# Patient Record
Sex: Female | Born: 1990 | Race: Black or African American | Hispanic: No | Marital: Single | Smoking: Current every day smoker
Health system: Southern US, Community
[De-identification: ages and names within clinical notes are randomized; demographics above are authoritative.]

## PROBLEM LIST (undated history)

## (undated) DIAGNOSIS — J45909 Unspecified asthma, uncomplicated: Secondary | ICD-10-CM

## (undated) DIAGNOSIS — F99 Mental disorder, not otherwise specified: Secondary | ICD-10-CM

## (undated) HISTORY — DX: Mental disorder, not otherwise specified: F99

---
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
## 2012-07-08 ENCOUNTER — Emergency Department (INDEPENDENT_AMBULATORY_CARE_PROVIDER_SITE_OTHER)
Admission: EM | Admit: 2012-07-08 | Discharge: 2012-07-08 | Disposition: A | Discharge status: Home / Self Care | Payer: Medicaid Other | Source: Home / Self Care | Attending: Family Medicine | Admitting: Family Medicine

## 2012-07-08 ENCOUNTER — Other Ambulatory Visit (HOSPITAL_COMMUNITY)
Admission: RE | Admit: 2012-07-08 | Discharge: 2012-07-08 | Disposition: A | Discharge status: Home / Self Care | Payer: Medicaid Other | Source: Ambulatory Visit | Attending: Family Medicine | Admitting: Family Medicine

## 2012-07-08 DIAGNOSIS — Z113 Encounter for screening for infections with a predominantly sexual mode of transmission: Secondary | ICD-10-CM | POA: Insufficient documentation

## 2012-07-08 DIAGNOSIS — N760 Acute vaginitis: Secondary | ICD-10-CM | POA: Insufficient documentation

## 2012-07-08 LAB — POCT URINALYSIS DIP (DEVICE)
Bilirubin Urine: NEGATIVE
Glucose, UA: NEGATIVE mg/dL
Nitrite: NEGATIVE
Protein, ur: NEGATIVE mg/dL
Specific Gravity, Urine: >=1.03 (ref 1.005–1.030)
Urobilinogen, UA: 0.2 mg/dL (ref 0.0–1.0)
pH: 6 (ref 5.0–8.0)

## 2012-07-08 LAB — POCT PREGNANCY, URINE: Preg Test, Ur: NEGATIVE

## 2012-07-08 MED ORDER — CEFTRIAXONE SODIUM 250 MG IJ SOLR
INTRAMUSCULAR | Status: AC
  Filled 2012-07-08: qty 250

## 2012-07-08 MED ORDER — CEFTRIAXONE SODIUM 1 G IJ SOLR
250.0000 mg | Freq: Once | INTRAMUSCULAR | Status: AC
  Administered 2012-07-08: 250 mg via INTRAMUSCULAR

## 2012-07-08 MED ORDER — LIDOCAINE HCL (PF) 1 % IJ SOLN
INTRAMUSCULAR | Status: AC
  Filled 2012-07-08: qty 5

## 2012-07-08 MED ORDER — METRONIDAZOLE 500 MG PO TABS: 500.0000 mg | ORAL_TABLET | Freq: Two times a day (BID) | ORAL | Status: DC

## 2012-07-08 NOTE — ED Provider Notes (Signed)
History     CSN: [id number]  Arrival date & time 07/08/12  1613   First MD Initiated Contact with Patient 07/08/12 1636      Chief Complaint  Patient presents with  . Vaginal Discharge  . Possible Pregnancy    (Consider location/radiation/quality/duration/timing/severity/associated sxs/prior treatment) Patient is a 22 y.o. female presenting with vaginal discharge and pregnancy problem. The history is provided by the patient.  Vaginal Discharge Quality:  Yellow Duration:  2 days Progression:  Unchanged Context: not recent antibiotic use   Associated symptoms: no abdominal pain, no dysuria, no fever, no genital lesions, no urinary frequency, no vaginal itching and no vomiting   Risk factors: no STI exposure and no unprotected sex   Possible Pregnancy  Primary symptoms include vaginal discharge. Patient reports no abdominal pain and no vaginal bleeding.  The vaginal discharge is not associated with dysuria.  Associated symptoms include no dysuria, no fever and no vomiting.    No past medical history on file.  No past surgical history on file.  No family history on file.  History  Substance Use Topics  . Smoking status: Not on file  . Smokeless tobacco: Not on file  . Alcohol Use: Not on file    OB History   No data available      Review of Systems  Constitutional: Negative.  Negative for fever.  Gastrointestinal: Negative.  Negative for vomiting and abdominal pain.  Genitourinary: Positive for vaginal discharge. Negative for dysuria, urgency, vaginal bleeding, menstrual problem and pelvic pain.    Allergies  Review of patient's allergies indicates not on file.  Home Medications   Current Outpatient Rx  Name  Route  Sig  Dispense  Refill  . metroNIDAZOLE (FLAGYL) 500 MG tablet   Oral   Take 1 tablet (500 mg total) by mouth 2 (two) times daily.   14 tablet   0     BP 120/71  Pulse 97  Temp(Src) 98.6 F (37 C) (Oral)  Resp 19  SpO2 98%  Physical  Exam  Nursing note and vitals reviewed. Constitutional: She is oriented to person, place, and time. She appears well-developed and well-nourished.  Abdominal: Soft. Bowel sounds are normal. There is no tenderness.  Genitourinary: Uterus normal. There is no rash on the right labia. There is no rash on the left labia. Cervix exhibits no motion tenderness and no discharge. No erythema or tenderness around the vagina. No foreign body around the vagina. Vaginal discharge found.  Neurological: She is alert and oriented to person, place, and time.  Skin: Skin is warm and dry.    ED Course  Procedures (including critical care time)  Labs Reviewed  POCT URINALYSIS DIP (DEVICE) - Abnormal; Notable for the following:    Ketones, ur TRACE (*)    Hgb urine dipstick TRACE (*)    Leukocytes, UA SMALL (*)    All other components within normal limits  POCT PREGNANCY, URINE  CERVICOVAGINAL ANCILLARY ONLY   No results found.   1. Acute vaginitis       MDM          Linna Hoff, MD 07/08/12 [phone]

## 2012-07-08 NOTE — ED Notes (Signed)
Pt here with c/o vaginal d/c that started off with whitish color post menstrual, but now has brownish with foul odor x 3 dys. Denies back pain or n/v. LMP 07/02/12

## 2012-07-08 NOTE — ED Notes (Signed)
Pt given 250mg  rochephin without complication. Will await post 15 min before d/c

## 2012-07-11 NOTE — ED Notes (Signed)
GC/Chlamydia neg., Affirm: Candida and Trich neg., Gardnerella pos.  Pt. adequately treated with Flagyl. Vassie Moselle 07/11/2012

## 2012-09-18 ENCOUNTER — Other Ambulatory Visit: Payer: Self-pay | Admitting: Family Medicine

## 2012-09-18 ENCOUNTER — Encounter (HOSPITAL_COMMUNITY): Payer: Self-pay | Admitting: Emergency Medicine

## 2012-09-18 ENCOUNTER — Emergency Department (INDEPENDENT_AMBULATORY_CARE_PROVIDER_SITE_OTHER)
Admission: EM | Admit: 2012-09-18 | Discharge: 2012-09-18 | Disposition: A | Discharge status: Home / Self Care | Payer: Self-pay | Source: Home / Self Care

## 2012-09-18 ENCOUNTER — Other Ambulatory Visit (HOSPITAL_COMMUNITY)
Admission: RE | Admit: 2012-09-18 | Discharge: 2012-09-18 | Disposition: A | Discharge status: Home / Self Care | Payer: Self-pay | Source: Ambulatory Visit | Attending: Family Medicine | Admitting: Family Medicine

## 2012-09-18 DIAGNOSIS — N760 Acute vaginitis: Secondary | ICD-10-CM

## 2012-09-18 DIAGNOSIS — B9689 Other specified bacterial agents as the cause of diseases classified elsewhere: Secondary | ICD-10-CM

## 2012-09-18 DIAGNOSIS — A499 Bacterial infection, unspecified: Secondary | ICD-10-CM

## 2012-09-18 DIAGNOSIS — Z113 Encounter for screening for infections with a predominantly sexual mode of transmission: Secondary | ICD-10-CM | POA: Insufficient documentation

## 2012-09-18 HISTORY — DX: Unspecified asthma, uncomplicated: J45.909

## 2012-09-18 LAB — POCT URINALYSIS DIP (DEVICE)
Glucose, UA: NEGATIVE mg/dL
Hgb urine dipstick: NEGATIVE
Nitrite: NEGATIVE
Protein, ur: NEGATIVE mg/dL
Specific Gravity, Urine: >=1.03 (ref 1.005–1.030)
Urobilinogen, UA: 0.2 mg/dL (ref 0.0–1.0)

## 2012-09-18 MED ORDER — CLINDAMYCIN PHOSPHATE 2 % VA CREA: 1.0000 | TOPICAL_CREAM | Freq: Every day | VAGINAL | Status: DC

## 2012-09-18 MED ORDER — METRONIDAZOLE 500 MG PO TABS: 500.0000 mg | ORAL_TABLET | Freq: Two times a day (BID) | ORAL | Status: DC

## 2012-09-18 NOTE — ED Provider Notes (Signed)
Laura Shaffer is a 22 y.o. female who presents to Urgent Care today for vaginal discharge present for the last 5 days. Patient notes a brown foul-smelling discharge consistent with prior episodes of BV. She denies any significant abdominal pain nausea diarrhea vomiting fever or chills. She denies any dysuria. She feels well otherwise. She has not tried any medications for this problem   Past Medical History  Diagnosis Date  . Asthma    History  Substance Use Topics  . Smoking status: Never Smoker   . Smokeless tobacco: Not on file  . Alcohol Use: No   ROS as above Medications reviewed. No current facility-administered medications for this encounter.   Current Outpatient Prescriptions  Medication Sig Dispense Refill  . metroNIDAZOLE (FLAGYL) 500 MG tablet Take 1 tablet (500 mg total) by mouth 2 (two) times daily.  14 tablet  0    Exam:  BP 129/76  Pulse 96  Temp(Src) 98.6 F (37 C) (Oral)  Resp 20  SpO2 100% Gen: Well NAD GYN: Normal appearing external genitalia. Vaginal canal with thin white discharge. Normal-appearing cervix.  No results found for this or any previous visit (from the past 24 hour(s)). No results found.  Assessment and Plan: 22 y.o. female with BV is likely the cause of vaginal discharge.  Obtain cytology of vaginal secretions for trichomonas gonorrhea Chlamydia BV and yeast. These are pending.  We'll treat empirically with either Flagyl for one week or topical clindamycin cream.  Followup as needed Discussed warning signs or symptoms. Please see discharge instructions. Patient expresses understanding.     Rodolph Bong, MD 09/18/12 2000

## 2012-09-18 NOTE — ED Notes (Signed)
Pt c/o a brown vag d/c onset 5 days Sxs include: mild abd cramping that started today Denies: fevers, urinary sxs, back pain Alert w/no signs of acute distress.

## 2012-09-19 NOTE — ED Notes (Addendum)
Candida and Trich neg., Gardnerella pos.  Pt. adequately treated with Flagyl.  GC/Chlamydia pending. Laura Shaffer 09/19/2012 GC/Chlamydia neg.

## 2012-12-16 ENCOUNTER — Encounter (HOSPITAL_COMMUNITY): Payer: Self-pay | Admitting: Emergency Medicine

## 2012-12-16 ENCOUNTER — Other Ambulatory Visit (HOSPITAL_COMMUNITY)
Admission: RE | Admit: 2012-12-16 | Discharge: 2012-12-16 | Disposition: A | Discharge status: Home / Self Care | Payer: Medicaid Other | Source: Ambulatory Visit | Attending: Family Medicine | Admitting: Family Medicine

## 2012-12-16 ENCOUNTER — Emergency Department (INDEPENDENT_AMBULATORY_CARE_PROVIDER_SITE_OTHER)
Admission: EM | Admit: 2012-12-16 | Discharge: 2012-12-16 | Disposition: A | Discharge status: Home / Self Care | Payer: Self-pay | Source: Home / Self Care

## 2012-12-16 DIAGNOSIS — B9689 Other specified bacterial agents as the cause of diseases classified elsewhere: Secondary | ICD-10-CM

## 2012-12-16 DIAGNOSIS — A499 Bacterial infection, unspecified: Secondary | ICD-10-CM

## 2012-12-16 DIAGNOSIS — N760 Acute vaginitis: Secondary | ICD-10-CM | POA: Insufficient documentation

## 2012-12-16 DIAGNOSIS — Z113 Encounter for screening for infections with a predominantly sexual mode of transmission: Secondary | ICD-10-CM | POA: Insufficient documentation

## 2012-12-16 LAB — POCT URINALYSIS DIP (DEVICE)
Hgb urine dipstick: NEGATIVE
Nitrite: NEGATIVE
Protein, ur: NEGATIVE mg/dL
Specific Gravity, Urine: 1.025 (ref 1.005–1.030)
Urobilinogen, UA: 0.2 mg/dL (ref 0.0–1.0)

## 2012-12-16 MED ORDER — METRONIDAZOLE 500 MG PO TABS: 500.0000 mg | ORAL_TABLET | Freq: Two times a day (BID) | ORAL | Status: DC

## 2012-12-16 NOTE — ED Notes (Signed)
1wk hx of vaginal odor, and yellowish discharge.  Was tx 79month ago for same symptoms, dx with vaginitis, rx with flagyl.  No abdominal pain. These symptoms happen after periods, and after sexual intercourse.

## 2012-12-16 NOTE — ED Provider Notes (Signed)
CSN: [id number]     Arrival date & time 12/16/12  1159 History   None    Chief Complaint  Patient presents with  . Vaginal Discharge    1wk hx of vaginal odor, and yellowish discharge.  Was tx 32month ago for same symptoms, dx with vaginitis, rx with flagyl.     (Consider location/radiation/quality/duration/timing/severity/associated sxs/prior Treatment) HPI Comments: C/O vag D/C for 1 week. This occurs after her menses and intercourse fairly frequently. No pain.   Past Medical History  Diagnosis Date  . Asthma    History reviewed. No pertinent past surgical history. No family history on file. History  Substance Use Topics  . Smoking status: Never Smoker   . Smokeless tobacco: Not on file  . Alcohol Use: No   OB History   Grav Para Term Preterm Abortions TAB SAB Ect Mult Living                 Review of Systems  Constitutional: Negative.   Respiratory: Negative.   Gastrointestinal: Negative.   Genitourinary: Positive for vaginal discharge and menstrual problem. Negative for dysuria, frequency, flank pain, genital sores, vaginal pain and pelvic pain.  Neurological: Negative.     Allergies  Review of patient's allergies indicates no known allergies.  Home Medications   Current Outpatient Rx  Name  Route  Sig  Dispense  Refill  . loratadine (CLARITIN) 10 MG tablet   Oral   Take 10 mg by mouth daily.         . metroNIDAZOLE (FLAGYL) 500 MG tablet   Oral   Take 1 tablet (500 mg total) by mouth 2 (two) times daily. X 7 days   14 tablet   0    LMP 12/04/2012 Physical Exam  Nursing note and vitals reviewed. Constitutional: She is oriented to person, place, and time. She appears well-developed and well-nourished.  Neck: Neck supple.  Cardiovascular: Normal rate.   Pulmonary/Chest: Effort normal. No respiratory distress.  Abdominal: Soft. She exhibits no distension. There is no tenderness.  Genitourinary:  NEFG Coupious amt thick white vag discharge mostly  accumulated in the vaginal vault and surrounding the cx. No CMT. Ectacx without lesions  Neurological: She is alert and oriented to person, place, and time.  Skin: Skin is warm and dry. No rash noted.    ED Course  Procedures (including critical care time) Labs Review Labs Reviewed  POCT URINALYSIS DIP (DEVICE) - Abnormal; Notable for the following:    Leukocytes, UA TRACE (*)    All other components within normal limits  POCT PREGNANCY, URINE   Imaging Review No results found.  Results for orders placed during the hospital encounter of 12/16/12  POCT URINALYSIS DIP (DEVICE)      Result Value Range   Glucose, UA NEGATIVE  NEGATIVE mg/dL   Bilirubin Urine NEGATIVE  NEGATIVE   Ketones, ur NEGATIVE  NEGATIVE mg/dL   Specific Gravity, Urine 1.025  1.005 - 1.030   Hgb urine dipstick NEGATIVE  NEGATIVE   pH 7.0  5.0 - 8.0   Protein, ur NEGATIVE  NEGATIVE mg/dL   Urobilinogen, UA 0.2  0.0 - 1.0 mg/dL   Nitrite NEGATIVE  NEGATIVE   Leukocytes, UA TRACE (*) NEGATIVE  POCT PREGNANCY, URINE      Result Value Range   Preg Test, Ur NEGATIVE  NEGATIVE     MDM   1. BV (bacterial vaginosis)   2. Vaginitis      Flagyl 500 bid Cer cytology pending.  Hayden Rasmussen, NP 12/16/12 1451

## 2012-12-18 NOTE — ED Notes (Signed)
GC/Chlamydia neg., Affirm: Candida and Gardnerella pos., Trich neg.  Pt. adequately treated with Flagyl.  Message to Dr. Artis Flock. Laura Shaffer 12/18/2012

## 2012-12-19 NOTE — ED Provider Notes (Signed)
Medical screening examination/treatment/procedure(s) were performed by resident physician or non-physician practitioner and as supervising physician I was immediately available for consultation/collaboration.   KINDL,JAMES DOUGLAS MD.   James D Kindl, MD 12/19/12 1906 

## 2012-12-21 ENCOUNTER — Telehealth (HOSPITAL_COMMUNITY): Payer: Self-pay | Admitting: *Deleted

## 2012-12-21 NOTE — ED Notes (Signed)
Dr. Artis Flock gave me a verbal order for Diflucan 150 mg. #1.  I called pt. Pt. verified x 2 and given results.  Pt. told she was adequately treated with Flagyl for bacterial vaginosis and needs Diflucan for the yeast infection. Pt. wants it called to the [facility] on [address] Rx. called to the pharmacist @ [phone]. Vassie Moselle 12/21/2012

## 2013-10-10 ENCOUNTER — Ambulatory Visit (INDEPENDENT_AMBULATORY_CARE_PROVIDER_SITE_OTHER): Payer: Medicaid Other | Admitting: Obstetrics & Gynecology

## 2013-10-10 ENCOUNTER — Other Ambulatory Visit (HOSPITAL_COMMUNITY)
Admission: RE | Admit: 2013-10-10 | Discharge: 2013-10-10 | Disposition: A | Discharge status: Home / Self Care | Payer: Medicaid Other | Source: Ambulatory Visit | Attending: Obstetrics & Gynecology | Admitting: Obstetrics & Gynecology

## 2013-10-10 ENCOUNTER — Encounter: Payer: Self-pay | Admitting: Obstetrics & Gynecology

## 2013-10-10 VITALS — BP 118/67 | HR 84 | Resp 16 | Ht 64.0 in | Wt 112.0 lb

## 2013-10-10 DIAGNOSIS — Z01419 Encounter for gynecological examination (general) (routine) without abnormal findings: Secondary | ICD-10-CM

## 2013-10-10 DIAGNOSIS — N9489 Other specified conditions associated with female genital organs and menstrual cycle: Secondary | ICD-10-CM | POA: Diagnosis not present

## 2013-10-10 DIAGNOSIS — Z113 Encounter for screening for infections with a predominantly sexual mode of transmission: Secondary | ICD-10-CM | POA: Insufficient documentation

## 2013-10-10 DIAGNOSIS — N760 Acute vaginitis: Secondary | ICD-10-CM | POA: Insufficient documentation

## 2013-10-10 DIAGNOSIS — N898 Other specified noninflammatory disorders of vagina: Secondary | ICD-10-CM | POA: Insufficient documentation

## 2013-10-10 DIAGNOSIS — Z308 Encounter for other contraceptive management: Secondary | ICD-10-CM

## 2013-10-10 LAB — CERVICOVAGINAL ANCILLARY ONLY
WET PREP (BD AFFIRM): NEGATIVE
WET PREP (BD AFFIRM): POSITIVE — AB
WET PREP (BD AFFIRM): POSITIVE — AB

## 2013-10-10 NOTE — Progress Notes (Signed)
   Subjective:     Laura Shaffer is a 23 y.o. female here for a routine exam.  Current complaints: vaginal odor, recurrent BV.  Personal health questionnaire reviewed: yes.   Pt considering a more reliable form of birth control Gynecologic History Patient's last menstrual period was 09/26/2013. Contraception: condoms Last Pap: nml--pt unsure of when  Obstetric History OB History  Gravida Para Term Preterm AB SAB TAB Ectopic Multiple Living  0    # Outcome Date GA Lbr Len/2nd Weight Sex Delivery Anes PTL Lv  1 SAB                The following portions of the patient's history were reviewed and updated as appropriate: allergies, current medications, past family history, past medical history, past social history, past surgical history and problem list.  Review of Systems Pertinent items are noted in HPI.    Objective:      Filed Vitals:   10/10/13 0928  BP: 118/67  Pulse: 84  Resp: 16  Height:  (1.626 m)  Weight: 112 lb (50.803 kg)   Vitals:  WNL General appearance: alert, cooperative and no distress Head: Normocephalic, without obvious abnormality, atraumatic Eyes: negative Throat: lips, mucosa, and tongue normal; teeth and gums normal Lungs: clear to auscultation bilaterally Breasts: normal appearance, no masses or tenderness, No nipple retraction or dimpling, No nipple discharge or bleeding Heart: regular rate and rhythm Abdomen: soft, non-tender; bowel sounds normal; no masses,  no organomegaly  Pelvic:  External Genitalia:  Tanner V, no lesion Urethra:  nml Vagina:  Pink, normal rugae, no blood, +thick white adherent discharge Cervix:  No CMT, no lesion Uterus:  Normal size and contour, non tender Adnexa:  Normal, no masses, non tender  Extremities: no edema, redness or tenderness in the calves or thighs Skin: no lesions or rash Lymph nodes: Axillary adenopathy: none        Assessment:    Healthy female exam.    Plan:    Education reviewed: safe sex/STD prevention. Contraception: condoms. Follow up in: 3 weeks. Pap with co testing BD affirm Discussed boric acid   Need to get on better contraception first!

## 2013-10-11 ENCOUNTER — Other Ambulatory Visit: Payer: Self-pay | Admitting: Obstetrics & Gynecology

## 2013-10-11 DIAGNOSIS — R634 Abnormal weight loss: Secondary | ICD-10-CM

## 2013-10-11 LAB — CYTOLOGY - PAP

## 2013-10-11 MED ORDER — FLUCONAZOLE 150 MG PO TABS: 150.0000 mg | ORAL_TABLET | Freq: Once | ORAL | Status: DC

## 2013-10-11 MED ORDER — METRONIDAZOLE 500 MG PO TABS: 500.0000 mg | ORAL_TABLET | Freq: Two times a day (BID) | ORAL | Status: DC

## 2013-10-28 ENCOUNTER — Ambulatory Visit: Payer: Medicaid Other | Admitting: Obstetrics & Gynecology

## 2013-11-06 ENCOUNTER — Encounter (HOSPITAL_COMMUNITY): Payer: Self-pay | Admitting: Emergency Medicine

## 2013-11-06 ENCOUNTER — Emergency Department (INDEPENDENT_AMBULATORY_CARE_PROVIDER_SITE_OTHER)
Admission: EM | Admit: 2013-11-06 | Discharge: 2013-11-06 | Disposition: A | Discharge status: Home / Self Care | Payer: Self-pay | Source: Home / Self Care | Attending: Emergency Medicine | Admitting: Emergency Medicine

## 2013-11-06 DIAGNOSIS — J039 Acute tonsillitis, unspecified: Secondary | ICD-10-CM

## 2013-11-06 LAB — POCT URINALYSIS DIP (DEVICE)
BILIRUBIN URINE: NEGATIVE
GLUCOSE, UA: NEGATIVE mg/dL
Nitrite: NEGATIVE
Protein, ur: 30 mg/dL — AB
Specific Gravity, Urine: 1.025 (ref 1.005–1.030)
Urobilinogen, UA: 0.2 mg/dL (ref 0.0–1.0)
pH: 5.5 (ref 5.0–8.0)

## 2013-11-06 LAB — POCT INFECTIOUS MONO SCREEN: Mono Screen: NEGATIVE

## 2013-11-06 LAB — POCT RAPID STREP A: Streptococcus, Group A Screen (Direct): NEGATIVE

## 2013-11-06 LAB — POCT PREGNANCY, URINE: PREG TEST UR: NEGATIVE

## 2013-11-06 MED ORDER — ACETAMINOPHEN 325 MG PO TABS
ORAL_TABLET | ORAL | Status: AC
  Filled 2013-11-06: qty 2

## 2013-11-06 MED ORDER — AMOXICILLIN 500 MG PO CAPS: 500.0000 mg | ORAL_CAPSULE | Freq: Three times a day (TID) | ORAL | Status: DC

## 2013-11-06 MED ORDER — ACETAMINOPHEN 325 MG PO TABS
650.0000 mg | ORAL_TABLET | Freq: Once | ORAL | Status: AC
  Administered 2013-11-06: 650 mg via ORAL

## 2013-11-06 MED ORDER — PREDNISONE 20 MG PO TABS: 20.0000 mg | ORAL_TABLET | Freq: Two times a day (BID) | ORAL | Status: DC

## 2013-11-06 MED ORDER — HYDROCODONE-ACETAMINOPHEN 5-325 MG PO TABS: ORAL_TABLET | ORAL | Status: DC

## 2013-11-06 NOTE — Discharge Instructions (Signed)

## 2013-11-06 NOTE — ED Notes (Signed)
C/o body aches, cough, flu like symptoms since yesterday . Back pain x 2 days

## 2013-11-06 NOTE — ED Provider Notes (Signed)
Chief Complaint   Muscle Pain   History of Present Illness   Buford A Siever is a 23 year old female who's had a two-day history of severe sore throat and pain on swallowing. She has also had subjective fever, chills, sweats, headaches, and myalgias. She feels weak, tired, run down, nauseated. She has severe pain in her mid to lower back area and some nasal congestion. She denies any cough or GI symptoms. She's had no exposure to strep or mono and no prior history of strep throat or tonsillitis.   Review of Systems   Other than as noted above, the patient denies any of the following symptoms. Systemic:  No fever, chills, sweats, myalgias, or headache. Eye:  No redness, pain or drainage. ENT:  No earache, nasal congestion, sneezing, rhinorrhea, sinus pressure, sinus pain, or post nasal drip. Lungs:  No cough, sputum production, wheezing, shortness of breath, or chest pain. GI:  No abdominal pain, nausea, vomiting, or diarrhea. Skin:  No rash.  PMFSH   Past medical history, family history, social history, meds, and allergies were reviewed.   Physical Exam     Vital signs:  BP 130/81  Pulse 66  Temp(Src) 97.7 F (36.5 C) (Oral)  Resp 12  SpO2 98%  LMP 09/26/2013 General:  Alert, in no distress. Phonation was normal, no drooling, and patient was able to handle secretions well.  Eye:  No conjunctival injection or drainage. Lids were normal. ENT:  TMs and canals were normal, without erythema or inflammation.  Nasal mucosa was clear and uncongested, without drainage.  Mucous membranes were moist.  Exam of pharynx shows tonsils to be enlarged and red without exudate.  There were no oral ulcerations or lesions. There was no bulging of the tonsillar pillars, and the uvula was midline. Neck:  Supple, anterior cervical nodes were enlarged and tender, no posterior cervical adenopathy. Lungs:  No respiratory distress.  Lungs were clear to auscultation, without wheezes, rales or rhonchi.   Breath sounds were clear and equal bilaterally.  Heart:  Regular rhythm, without gallops, murmers or rubs. Skin:  Clear, warm, and dry, without rash or lesions.  Course in Urgent Care Center   The patient was given the following meds: Medications  acetaminophen (TYLENOL) tablet 650 mg (650 mg Oral Given 11/06/13 1243)   Assessment   The encounter diagnosis was Tonsillitis.  There is no evidence of a peritonsillar abscess, retropharyngeal abscess, or epiglottitis.    Plan     1.  Meds:  The following meds were prescribed:   New Prescriptions   AMOXICILLIN (AMOXIL) 500 MG CAPSULE    Take 1 capsule (500 mg total) by mouth 3 (three) times daily.   HYDROCODONE-ACETAMINOPHEN (NORCO/VICODIN) 5-325 MG PER TABLET    1 to 2 tabs every 4 to 6 hours as needed for pain.   PREDNISONE (DELTASONE) 20 MG TABLET    Take 1 tablet (20 mg total) by mouth 2 (two) times daily.    2.  Patient Education/Counseling:  The patient was given appropriate handouts, self care instructions, and instructed in symptomatic relief, including hot saline gargles, throat lozenges, infectious precautions, and need to trade out toothbrush.    3.  Follow up:  The patient was told to follow up here if no better in 3 to 4 days, or sooner if becoming worse in any way, and given some red flag symptoms such as difficulty swallowing or breathing which would prompt immediate return.      Reuben Likesavid C Kiyomi Pallo, MD 11/06/13  1331 

## 2013-11-07 LAB — URINE CULTURE
Colony Count: 30000
SPECIAL REQUESTS: NORMAL

## 2013-11-08 LAB — CULTURE, GROUP A STREP

## 2013-11-12 ENCOUNTER — Ambulatory Visit: Payer: Medicaid Other | Admitting: Obstetrics & Gynecology

## 2013-11-18 ENCOUNTER — Encounter (HOSPITAL_COMMUNITY): Payer: Self-pay | Admitting: Emergency Medicine

## 2014-09-20 ENCOUNTER — Encounter (HOSPITAL_COMMUNITY): Payer: Self-pay | Admitting: Emergency Medicine

## 2014-09-20 ENCOUNTER — Emergency Department (HOSPITAL_COMMUNITY)
Admission: EM | Admit: 2014-09-20 | Discharge: 2014-09-20 | Disposition: A | Discharge status: Home / Self Care | Payer: Medicaid Other | Attending: Emergency Medicine | Admitting: Emergency Medicine

## 2014-09-20 DIAGNOSIS — J45909 Unspecified asthma, uncomplicated: Secondary | ICD-10-CM | POA: Insufficient documentation

## 2014-09-20 DIAGNOSIS — Z3202 Encounter for pregnancy test, result negative: Secondary | ICD-10-CM | POA: Insufficient documentation

## 2014-09-20 DIAGNOSIS — J0110 Acute frontal sinusitis, unspecified: Secondary | ICD-10-CM

## 2014-09-20 LAB — POC URINE PREG, ED: PREG TEST UR: NEGATIVE

## 2014-09-20 MED ORDER — OXYMETAZOLINE HCL 0.05 % NA SOLN: 1.0000 | Freq: Two times a day (BID) | NASAL | Status: DC | PRN

## 2014-09-20 MED ORDER — SODIUM CHLORIDE 0.9 % IV BOLUS (SEPSIS)
1000.0000 mL | Freq: Once | INTRAVENOUS | Status: AC
Start: 2014-09-20 — End: 2014-09-20
  Administered 2014-09-20: 1000 mL via INTRAVENOUS

## 2014-09-20 MED ORDER — PSEUDOEPHEDRINE HCL 60 MG PO TABS: 60.0000 mg | ORAL_TABLET | Freq: Four times a day (QID) | ORAL | Status: DC | PRN

## 2014-09-20 MED ORDER — KETOROLAC TROMETHAMINE 30 MG/ML IJ SOLN
30.0000 mg | Freq: Once | INTRAMUSCULAR | Status: AC
  Administered 2014-09-20: 30 mg via INTRAVENOUS
  Filled 2014-09-20: qty 1

## 2014-09-20 MED ORDER — IBUPROFEN 600 MG PO TABS: 600.0000 mg | ORAL_TABLET | Freq: Four times a day (QID) | ORAL | Status: DC | PRN

## 2014-09-20 NOTE — Discharge Instructions (Signed)
Read the information below.  Use the prescribed medication as directed.  Please discuss all new medications with your pharmacist.  You may return to the Emergency Department at any time for worsening condition or any new symptoms that concern you.  If you develop high fevers that do not resolve with tylenol or ibuprofen, increased facial pain, you have difficulty swallowing or breathing, or you are unable to tolerate fluids by mouth, return to the ER for a recheck.      Sinusitis Sinusitis is redness, soreness, and puffiness (inflammation) of the air pockets in the bones of your face (sinuses). The redness, soreness, and puffiness can cause air and mucus to get trapped in your sinuses. This can allow germs to grow and cause an infection.  HOME CARE   Drink enough fluids to keep your pee (urine) clear or pale yellow.  Use a humidifier in your home.  Run a hot shower to create steam in the bathroom. Sit in the bathroom with the door closed. Breathe in the steam 3-4 times a day.  Put a warm, moist washcloth on your face 3-4 times a day, or as told by your doctor.  Use salt water sprays (saline sprays) to wet the thick fluid in your nose. This can help the sinuses drain.  Only take medicine as told by your doctor. GET HELP RIGHT AWAY IF:   Your pain gets worse.  You have very bad headaches.  You are sick to your stomach (nauseous).  You throw up (vomit).  You are very sleepy (drowsy) all the time.  Your face is puffy (swollen).  Your vision changes.  You have a stiff neck.  You have trouble breathing. MAKE SURE YOU:   Understand these instructions.  Will watch your condition.  Will get help right away if you are not doing well or get worse. Document Released: 06/22/2007 Document Revised: 09/28/2011 Document Reviewed: 08/09/2011 [facility] Patient Information 2015 [place], [place]. This information is not intended to replace advice given to you by your health care provider. Make  sure you discuss any questions you have with your health care provider.    Emergency Department Resource Guide 1) Find a Doctor and Pay Out of Pocket Although you won't have to find out who is covered by your insurance plan, it is a good idea to ask around and get recommendations. You will then need to call the office and see if the doctor you have chosen will accept you as a new patient and what types of options they offer for patients who are self-pay. Some doctors offer discounts or will set up payment plans for their patients who do not have insurance, but you will need to ask so you aren't surprised when you get to your appointment.  2) Contact Your Local Health Department Not all health departments have doctors that can see patients for sick visits, but many do, so it is worth a call to see if yours does. If you don't know where your local health department is, you can check in your phone book. The CDC also has a tool to help you locate your state's health department, and many state websites also have listings of all of their local health departments.  3) Find a Walk-in Clinic If your illness is not likely to be very severe or complicated, you may want to try a walk in clinic. These are popping up all over the country in pharmacies, drugstores, and shopping centers. They're usually staffed by nurse practitioners or  physician assistants that have been trained to treat common illnesses and complaints. They're usually fairly quick and inexpensive. However, if you have serious medical issues or chronic medical problems, these are probably not your best option.  No Primary Care Doctor: - Call Health Connect at  [phone] - they can help you locate a primary care doctor that  accepts your insurance, provides certain services, etc. - Physician Referral Service- [phone]  Chronic Pain Problems: Organization         Address  Phone   Notes  [facility]  [phone]  Patients need to be referred by their primary care doctor.   Medication Assistance: Organization         Address  Phone   Notes  [facility] [address] [place] [place], [place] [address] [phone] --Must be a resident of [facility] -- Must have NO insurance coverage whatsoever (no Medicaid/ Medicare, etc.) -- The pt. MUST have a primary care doctor that directs their care regularly and follows them in the community   [facility]ist  [phone]   [employer]  [phone]    Agencies that provide inexpensive medical care: Organization         Address  Phone   Notes  [facility]  [phone]   [facility]    [phone]   [facility] [address] [place], [place] [address] [phone]   [facility] [address] [place] [place] [phone]   Planned Parenthood    [phone]   [facility]    [phone]   [facility] [facility]  [address], [place] Phone:  [phone], Fax:  [phone] Hours of Operation:  9 am - 6 pm, M-F.  Also accepts Medicaid/Medicare and self-pay.  [facility]  [address], [place] Phone: [phone], Fax: [phone]. Hours of Operation:  8:30 am - 5:30 pm, M-F.  Also accepts Medicaid and self-pay.  [facility] [place] [address], [place] Phone: [phone]   [facility] [address] Natasha Bence [place], [place] [phone], Ext. [phone] Mondays & Thursdays: 7-9 AM.  First 15 patients are seen on a first come, first serve basis.    Medicaid-accepting [facility] Providers:  Organization         Address  Phone   Notes  [facility] [address], [place] [phone] Also accepts self-pay patients.  [facility] [address] Laurell Josephs [place], [place]  [phone]   [facility] [address], [place] [phone]   [facility] [address] [place] [phone]   Renaye Rakers [address], [place]   [phone] Only accepts [place] Access [place] patients after they have their name applied to their card.   Self-Pay (no insurance) in [facility]:  Organization         Address  Phone   Notes  Sickle Cell Patients, [facility] [address] [place], [place] [phone]   [facility] [address] [place], [place] [phone]   [facility] [place]  [address], [place] [phone]   [facility]/Dr. Julio Sicks  [address] [place]  [place] [address], [place] or [address], [place] [phone] Phone number for both [place] and [place] locations is the same.  [facility] [address] [place] [phone]   [facility] [address], [place] or [address] [phone]   [facility] [address] [place] [phone], phone; [phone], fax Sees patients 1st and 3rd Saturday of every month.  Must not qualify for public or private insurance (i.e. Medicaid, Medicare, [place] Health Choice, Veterans' Benefits)  Household income should be no more than 200% of the poverty level The clinic cannot treat you if you are pregnant or think you are pregnant  Sexually transmitted diseases are not treated at the clinic.    Dental Care: Organization         Address  Phone  Notes  [facility] Department of [facility] [address] [place], [place] [phone] Accepts children up to age 2 who are enrolled in [place] or [place] Health Choice; pregnant women with a Medicaid card; and children who have applied for Medicaid or [place] Health Choice, but were  declined, whose parents can pay a reduced fee at time of service.  [facility] Department of [facility]  [address], [place] [phone] Accepts children up to age 95 who are enrolled in [place] or [place] Health Choice; pregnant women with a Medicaid card; and children who have applied for Medicaid or [place] Health Choice, but were declined, whose parents can pay a reduced fee at time of service.  [facility]  [address] [place], [place] [phone] Patients are seen by appointment only. Walk-ins are not accepted. [facility] will see patients 39 years of age and older. Monday - Tuesday (8am-5pm) Most Wednesdays (8:30-5pm) $30 per visit, cash only  [facility]  [address], [facility] [phone] Patients are seen by appointment only. Walk-ins are not accepted. [facility] will see patients 35 years of age and older. One Wednesday Evening (Monthly: Volunteer Based).  $30 per visit, cash only  [employer]  [phone] for adults; Children under age 46, call Graduate Pediatric Dentistry at [phone]. Children aged 50-14, please call [phone] to request a pediatric application.  Dental services are provided in all areas of dental care including fillings, crowns and bridges, complete and partial dentures, implants, gum treatment, root canals, and extractions. Preventive care is also provided. Treatment is provided to both adults and children. Patients are selected via a lottery and there is often a waiting list.   [facility] [address], [place]  [phone] [email or web address]   [facility] [address] [place], [place] [phone], Ext. [phone] Second and Fourth Thursday of each month, opens at 6:30 AM; Clinic ends at 9 AM.  Patients are seen on a first-come first-served basis, and a limited number are seen during each clinic.    [facility]  [address] Ether Griffins [place], [place] [phone]   Eligibility Requirements You must have lived in [place], [place], or [place] counties for at least the last three months.   You cannot be eligible for state or federal sponsored [place], including [employer], [place], or [employer].   You generally cannot be eligible for healthcare insurance through your employer.    How  to apply: Eligibility screenings are held every Tuesday and Wednesday afternoon from 1:00 pm until 4:00 pm. You do not need an appointment for the interview!  [facility] [address] [place], [place] [phone]   [facility] Health [facility]  [phone]   [facility]  [phone]   [facility] Health [facility]  [phone]    Behavioral Health Resources in the Community: Intensive Outpatient Programs Organization         Address  Phone  Notes  [facility] Services [address], [place], [place] [phone]   [facility] Outpatient [address] [place], [place] [phone]   [facility] [address] [place], [place]  [phone]   [facility] Mental Health [address],  [place], [place] [phone] or [phone]   Substance Abuse Resources Organization         Address  Phone  Notes  [facility]  [phone]   [facility]  [phone]   [facility]  [phone]   Floydene Flock  [phone]   Residential & Outpatient Substance Abuse Program  [phone]   Psychological Services Organization         Address  Phone  Notes  [facility] Health  [phone]   [facility] Services  [phone]   [facility] [address] [place] [phone] or [phone]    Mobile Crisis Teams Organization         Address  Phone  Notes  [facility], Mobile Crisis Care  Unit  [phone]   [facility]  [address] [place], [place] [phone]   Doristine Locks [address] [place] [phone]    Self-Help/Support Groups Organization         Address  Phone             Notes  [facility] [place] - variety of support groups  336- I7437963 Call for more information  Narcotics Anonymous (NA), [facility] [address], [employer] [place]  2 meetings at this location   Statistician         Address  Phone  Notes  [facility] [address],    [place]  [phone]   [facility]  [address] [place] [address], [place], [place] [phone]   [facility] [address] [place], [place] [phone] Admissions: 8am-3pm M-F  [facility] [address]    [place], [place] [phone]   [facility] [address] [place], [place], [place] [phone]   [facility] [address]  [place], [place] [phone]   [facility] - Intensive Outpatient [address] Laurell Josephs [address], [place], [place] [phone]   [facility] ([facility] [address] [place]  [place], [place] [phone] or [phone]   [facility] ([facility]) [address], [place], [place] [phone] Accepts Medicaid  [facility] [address],  [place] [phone] Substance Abuse/Addiction Treatment   [facility] Organization         Address  Phone  Notes  [facility]  [phone]   Angie Fava, PhD [address] Ervin Knack [place], [place]   [phone] or [phone]   [facility] Behavioral   [address] [place], [place] [phone]   [facility] [address], [place],  [place] [phone] Insurance/Medicaid/sponsorship through [facility] and Families [address]                                     [place], [place] [phone] Therapy/tele-psych/case  [facility] [address]   [place], [place] [phone]    Dr. Lolly Mustache  [phone]   [facility]  [employer] [facility] Dept. 1) [address], [place] 2) [address], [place] 3)  [address], [place] [phone]  [phone]   [facility] Child Abuse Hotline [phone] or [phone] (After Hours)

## 2014-09-20 NOTE — ED Provider Notes (Signed)
CSN: [id number]     Arrival date & time 09/20/14  1426 History   First MD Initiated Contact with Patient 09/20/14 1533     Chief Complaint  Patient presents with  . Headache  . Generalized Body Aches     (Consider location/radiation/quality/duration/timing/severity/associated sxs/prior Treatment) The history is provided by the patient.     Pt with hx asthma p/w 4 days of headache, myalgias, chills, increased nasal congestion, rhinorrhea, sneezing, decreased appetite.  Her headache is intermittent, across her forehead, pounding, relieved temporarily with ibuprofen.  Feels like a typical headache for her, just more frequent than usual. She has also been lightheaded with standing.  States the course is improving.  Denies head trauma, neck pain or stiffness, "worst" headache of life, sudden onset or "thunderclap" headache.  Denies focal neurologic deficits.   Past Medical History  Diagnosis Date  . Asthma    History reviewed. No pertinent past surgical history. Family History  Problem Relation Age of Onset  . Diabetes Maternal Uncle   . Hypertension Maternal Uncle   . Hypertension Maternal Aunt   . Prostate cancer Maternal Uncle    Social History  Substance Use Topics  . Smoking status: Never Smoker   . Smokeless tobacco: None  . Alcohol Use: No   OB History    Gravida Para Term Preterm AB TAB SAB Ectopic Multiple Living   0     Review of Systems  All other systems reviewed and are negative.     Allergies  Review of patient's allergies indicates no known allergies.  Home Medications   Prior to Admission medications   Medication Sig Start Date End Date Taking? Authorizing Provider  cetirizine (ZYRTEC) 10 MG tablet Take 10 mg by mouth daily as needed for allergies.    Yes Historical Provider, MD  ibuprofen (ADVIL,MOTRIN) 200 MG tablet Take 400 mg by mouth every 6 (six) hours as needed for headache or mild pain.   Yes Historical Provider, MD  PRESCRIPTION  MEDICATION Apply 1 application topically daily as needed (acne). Applied to face   Yes Historical Provider, MD   BP 115/68 mmHg  Pulse 91  Temp(Src) 98.9 F (37.2 C) (Oral)  Resp 20  Ht  (1.626 m)  Wt 115 lb (52.164 kg)  BMI 19.73 kg/m2  SpO2 100%  LMP 09/09/2014 Physical Exam  Constitutional: She appears well-developed and well-nourished. No distress.  HENT:  Head: Normocephalic and atraumatic.  Nose: Mucosal edema present. Right sinus exhibits frontal sinus tenderness. Right sinus exhibits no maxillary sinus tenderness. Left sinus exhibits no maxillary sinus tenderness and no frontal sinus tenderness.  Neck: Normal range of motion. Neck supple. No rigidity. Normal range of motion present. No Brudzinski's sign and no Kernig's sign noted.  Cardiovascular: Normal rate and regular rhythm.   Pulmonary/Chest: Effort normal and breath sounds normal. No respiratory distress. She has no wheezes. She has no rales.  Abdominal: Soft. She exhibits no distension and no mass. There is no tenderness. There is no rebound and no guarding.  Neurological: She is alert.  CN II-XII intact, EOMs intact, no pronator drift, grip strengths equal bilaterally; strength 5/5 in all extremities, sensation intact in all extremities; finger to nose, heel to shin, rapid alternating movements normal; gait is normal.     Skin: She is not diaphoretic.  Nursing note and vitals reviewed.   ED Course  Procedures (including critical care time) Labs Review Labs Reviewed  POC  URINE PREG, ED    Imaging Review No results found. I have personally reviewed and evaluated these images and lab results as part of my medical decision-making.   EKG Interpretation None      MDM   Final diagnoses:  Acute frontal sinusitis, recurrence not specified    Afebrile, nontoxic patient with headaches, myalgias, increased nasal congestion, rhinorrhea, sneezing. Suspect sinusitis. IVF, toradol given.  Course is already  improving, prior to being seen in ED.  Doubt bacterial infection.  No meningeal signs.   D/C home with medications for symptoms, return precautions.  Discussed result, findings, treatment, and follow up  with patient.  Pt given return precautions.  Pt verbalizes understanding and agrees with plan.         Trixie Dredge, PA-C 09/20/14 1831  Lyndal Pulley, MD 09/21/14 [phone]

## 2014-09-20 NOTE — ED Notes (Signed)
Pt c/o headache and general body aches for past few days.

## 2015-09-14 ENCOUNTER — Emergency Department
Admission: EM | Admit: 2015-09-14 | Discharge: 2015-09-14 | Disposition: A | Discharge status: Home / Self Care | Payer: Self-pay | Attending: Emergency Medicine | Admitting: Emergency Medicine

## 2015-09-14 ENCOUNTER — Emergency Department: Payer: Self-pay

## 2015-09-14 ENCOUNTER — Encounter: Payer: Self-pay | Admitting: Emergency Medicine

## 2015-09-14 DIAGNOSIS — R197 Diarrhea, unspecified: Secondary | ICD-10-CM | POA: Insufficient documentation

## 2015-09-14 DIAGNOSIS — J45909 Unspecified asthma, uncomplicated: Secondary | ICD-10-CM | POA: Insufficient documentation

## 2015-09-14 DIAGNOSIS — R1084 Generalized abdominal pain: Secondary | ICD-10-CM | POA: Insufficient documentation

## 2015-09-14 DIAGNOSIS — R110 Nausea: Secondary | ICD-10-CM | POA: Insufficient documentation

## 2015-09-14 DIAGNOSIS — Z791 Long term (current) use of non-steroidal anti-inflammatories (NSAID): Secondary | ICD-10-CM | POA: Insufficient documentation

## 2015-09-14 LAB — URINALYSIS COMPLETE WITH MICROSCOPIC (ARMC ONLY)
BILIRUBIN URINE: NEGATIVE
Bacteria, UA: NONE SEEN
GLUCOSE, UA: NEGATIVE mg/dL
Hgb urine dipstick: NEGATIVE
Leukocytes, UA: NEGATIVE
NITRITE: NEGATIVE
PROTEIN: NEGATIVE mg/dL
SPECIFIC GRAVITY, URINE: 1.026 (ref 1.005–1.030)
pH: 5 (ref 5.0–8.0)

## 2015-09-14 LAB — COMPREHENSIVE METABOLIC PANEL
ALBUMIN: 4.4 g/dL (ref 3.5–5.0)
ALK PHOS: 46 U/L (ref 38–126)
ALT: 10 U/L — ABNORMAL LOW (ref 14–54)
ANION GAP: 7 (ref 5–15)
AST: 14 U/L — ABNORMAL LOW (ref 15–41)
BUN: 12 mg/dL (ref 6–20)
CALCIUM: 9.5 mg/dL (ref 8.9–10.3)
CO2: 25 mmol/L (ref 22–32)
Chloride: 104 mmol/L (ref 101–111)
Creatinine, Ser: 0.84 mg/dL (ref 0.44–1.00)
GFR calc Af Amer: >60 mL/min (ref >60)
GFR calc non Af Amer: >60 mL/min (ref >60)
GLUCOSE: 85 mg/dL (ref 65–99)
Potassium: 3.5 mmol/L (ref 3.5–5.1)
SODIUM: 136 mmol/L (ref 135–145)
TOTAL PROTEIN: 7.4 g/dL (ref 6.5–8.1)
Total Bilirubin: 0.5 mg/dL (ref 0.3–1.2)

## 2015-09-14 LAB — CBC
HCT: 41.9 % (ref 35.0–47.0)
HEMOGLOBIN: 14.1 g/dL (ref 12.0–16.0)
MCH: 30 pg (ref 26.0–34.0)
MCHC: 33.6 g/dL (ref 32.0–36.0)
MCV: 89.3 fL (ref 80.0–100.0)
PLATELETS: 173 10*3/uL (ref 150–440)
RBC: 4.69 MIL/uL (ref 3.80–5.20)
RDW: 13.1 % (ref 11.5–14.5)
WBC: 9.5 10*3/uL (ref 3.6–11.0)

## 2015-09-14 LAB — LIPASE, BLOOD: Lipase: 17 U/L (ref 11–51)

## 2015-09-14 MED ORDER — ONDANSETRON 4 MG PO TBDP
4.0000 mg | ORAL_TABLET | Freq: Once | ORAL | Status: AC | PRN
  Administered 2015-09-14: 4 mg via ORAL
  Filled 2015-09-14: qty 1

## 2015-09-14 MED ORDER — LOPERAMIDE HCL 2 MG PO CAPS
4.0000 mg | ORAL_CAPSULE | Freq: Once | ORAL | Status: AC
  Administered 2015-09-14: 4 mg via ORAL
  Filled 2015-09-14: qty 2

## 2015-09-14 MED ORDER — ONDANSETRON HCL 4 MG PO TABS: 4.0000 mg | ORAL_TABLET | Freq: Every day | ORAL | 1 refills | Status: DC | PRN

## 2015-09-14 NOTE — ED Provider Notes (Addendum)
[facility] Emergency Department Provider Note        Time seen: ----------------------------------------- 8:18 PM on 09/14/2015 -----------------------------------------    I have reviewed the triage vital signs and the nursing notes.   HISTORY  Chief Complaint Abdominal Pain    HPI Ferrell A Derocher is a 25 y.o. female who presents to ER with mid abdominal pain along with nausea and diarrhea for the last week. Patient thought she might be pregnant but her menses began at normal time. She denies urinary symptoms, denies blood in the stool.   Past Medical History:  Diagnosis Date  . Asthma     Patient Active Problem List   Diagnosis Date Noted  . Vaginal odor 10/10/2013    History reviewed. No pertinent surgical history.  Allergies Review of patient's allergies indicates no known allergies.  Social History Social History  Substance Use Topics  . Smoking status: Never Smoker  . Smokeless tobacco: Never Used  . Alcohol use Yes     Comment: occasionally     Review of Systems Constitutional: Negative for fever. Cardiovascular: Negative for chest pain. Respiratory: Negative for shortness of breath. Gastrointestinal: Positive for abdominal pain, diarrhea Genitourinary: Negative for dysuria. Musculoskeletal: Negative for back pain. Skin: Negative for rash. Neurological: Negative for headaches, focal weakness or numbness.  10-point ROS otherwise negative.  ____________________________________________   PHYSICAL EXAM:  VITAL SIGNS: ED Triage Vitals [09/14/15 2004]  Enc Vitals Group     BP 113/68     Pulse Rate (!) 57     Resp 18     Temp 98.9 F (37.2 C)     Temp Source Oral     SpO2 100 %     Weight 109 lb (49.4 kg)     Height 5\' 4"  (1.626 m)     Head Circumference      Peak Flow      Pain Score 8     Pain Loc      Pain Edu?      Excl. in GC?     Constitutional: Alert and oriented. Well appearing and in no  distress. Eyes: Conjunctivae are normal. PERRL. Normal extraocular movements. ENT   Head: Normocephalic and atraumatic.   Nose: No congestion/rhinnorhea.   Mouth/Throat: Mucous membranes are moist.   Neck: No stridor. Cardiovascular: Normal rate, regular rhythm. No murmurs, rubs, or gallops. Respiratory: Normal respiratory effort without tachypnea nor retractions. Breath sounds are clear and equal bilaterally. No wheezes/rales/rhonchi. Gastrointestinal: Mild periumbilical tenderness, no rebound or guarding, hyperactive bowel sounds Musculoskeletal: Nontender with normal range of motion in all extremities. No lower extremity tenderness nor edema. Neurologic:  Normal speech and language. No gross focal neurologic deficits are appreciated.  Skin:  Skin is warm, dry and intact. No rash noted. Psychiatric: Mood and affect are normal. Speech and behavior are normal.  ____________________________________________  ED COURSE:  Pertinent labs & imaging results that were available during my care of the patient were reviewed by me and considered in my medical decision making (see chart for details). Clinical Course  Patients in no acute distress, I will check basic labs, pregnant test. She will receive oral Zofran.  Procedures ____________________________________________   LABS (pertinent positives/negatives)  Labs Reviewed  COMPREHENSIVE METABOLIC PANEL - Abnormal; Notable for the following:       Result Value   AST 14 (*)    ALT 10 (*)    All other components within normal limits  URINALYSIS COMPLETEWITH MICROSCOPIC ([facility] ONLY) - Abnormal;  Notable for the following:    Color, Urine YELLOW (*)    APPearance CLEAR (*)    Ketones, ur TRACE (*)    Squamous Epithelial / LPF 0-5 (*)    All other components within normal limits  LIPASE, BLOOD  CBC  POC URINE PREG, ED    RADIOLOGY Images were viewed by me  Abdomen 2 view FINDINGS: Scattered gas and stool in the colon. No  small or large bowel distention. No free intra-abdominal air. No abnormal air-fluid levels. No radiopaque stones. Visualized bones appear intact.  IMPRESSION: Normal nonobstructive bowel gas pattern.   ____________________________________________  FINAL ASSESSMENT AND PLAN  Abdominal pain, diarrhea  Plan: Patient with labs and imaging as dictated above. Patient's no distress, likely viral etiology for her symptoms. She'll be discharged with antiemetics and encouraged to have close follow-up with her doctor   Emily FilbertWilliams, Leighla Chestnutt E, MD   Note: This dictation was prepared with Dragon dictation. Any transcriptional errors that result from this process are unintentional    Emily FilbertJonathan E Casilda Pickerill, MD 09/14/15 2149    Emily FilbertJonathan E Brainard Highfill, MD 09/14/15 2149

## 2015-09-14 NOTE — ED Notes (Signed)
POC pregnancy negative

## 2015-09-14 NOTE — ED Triage Notes (Signed)
Pt ambulatory to triage with steady gait with c/o mid abdominal pain along with nausea and diarrhea for the past week. Pt reports thought she might be pregnant but states menstrual began at normal time. Pt denies urinary symptoms. Pt denies blood in stool. Pt alert and oriented x 4, no increased work in breathing noted.

## 2016-02-25 ENCOUNTER — Encounter: Payer: Self-pay | Admitting: Emergency Medicine

## 2016-02-25 ENCOUNTER — Emergency Department
Admission: EM | Admit: 2016-02-25 | Discharge: 2016-02-25 | Disposition: A | Discharge status: Home / Self Care | Payer: Medicaid Other | Attending: Emergency Medicine | Admitting: Emergency Medicine

## 2016-02-25 DIAGNOSIS — R5383 Other fatigue: Secondary | ICD-10-CM | POA: Insufficient documentation

## 2016-02-25 DIAGNOSIS — Z791 Long term (current) use of non-steroidal anti-inflammatories (NSAID): Secondary | ICD-10-CM | POA: Insufficient documentation

## 2016-02-25 DIAGNOSIS — Z79899 Other long term (current) drug therapy: Secondary | ICD-10-CM | POA: Insufficient documentation

## 2016-02-25 DIAGNOSIS — J45909 Unspecified asthma, uncomplicated: Secondary | ICD-10-CM | POA: Insufficient documentation

## 2016-02-25 DIAGNOSIS — R634 Abnormal weight loss: Secondary | ICD-10-CM | POA: Insufficient documentation

## 2016-02-25 LAB — COMPREHENSIVE METABOLIC PANEL
ALT: 18 U/L (ref 14–54)
ANION GAP: 8 (ref 5–15)
AST: 21 U/L (ref 15–41)
Albumin: 4.1 g/dL (ref 3.5–5.0)
Alkaline Phosphatase: 40 U/L (ref 38–126)
BUN: 11 mg/dL (ref 6–20)
CHLORIDE: 102 mmol/L (ref 101–111)
CO2: 26 mmol/L (ref 22–32)
Calcium: 9.3 mg/dL (ref 8.9–10.3)
Creatinine, Ser: 0.93 mg/dL (ref 0.44–1.00)
Glucose, Bld: 84 mg/dL (ref 65–99)
POTASSIUM: 3.6 mmol/L (ref 3.5–5.1)
Sodium: 136 mmol/L (ref 135–145)
Total Bilirubin: 0.4 mg/dL (ref 0.3–1.2)
Total Protein: 7 g/dL (ref 6.5–8.1)

## 2016-02-25 LAB — URINALYSIS, COMPLETE (UACMP) WITH MICROSCOPIC
BACTERIA UA: NONE SEEN
BILIRUBIN URINE: NEGATIVE
Glucose, UA: NEGATIVE mg/dL
Hgb urine dipstick: NEGATIVE
KETONES UR: NEGATIVE mg/dL
Nitrite: NEGATIVE
Protein, ur: NEGATIVE mg/dL
Specific Gravity, Urine: 1.018 (ref 1.005–1.030)
pH: 6 (ref 5.0–8.0)

## 2016-02-25 LAB — CBC
HEMATOCRIT: 41.9 % (ref 35.0–47.0)
HEMOGLOBIN: 13.7 g/dL (ref 12.0–16.0)
MCH: 29.2 pg (ref 26.0–34.0)
MCHC: 32.7 g/dL (ref 32.0–36.0)
MCV: 89.5 fL (ref 80.0–100.0)
Platelets: 232 10*3/uL (ref 150–440)
RBC: 4.69 MIL/uL (ref 3.80–5.20)
RDW: 12.7 % (ref 11.5–14.5)
WBC: 11.1 10*3/uL — AB (ref 3.6–11.0)

## 2016-02-25 LAB — POCT PREGNANCY, URINE: PREG TEST UR: NEGATIVE

## 2016-02-25 NOTE — ED Notes (Signed)
Pt states she is here believing she is anemic - states "just don't feel right". Also states she lost her job and no longer has a pcp.

## 2016-02-25 NOTE — ED Provider Notes (Signed)
[facility] Emergency Department Provider Note  ____________________________________________   First MD Initiated Contact with Patient 02/25/16 2223     (approximate)  I have reviewed the triage vital signs and the nursing notes.   HISTORY  Chief Complaint Tremors; Diarrhea; and Anorexia    HPI Laura Shaffer is a 26 y.o. female with no significant PMH who presents for evaluation of a variety of complaints including intermittent hand tremors, weight loss over the last month, occasional loose stools, and occasional headache.  She feels fatigued and believes she is anemic.  She has been under a lot of stress, recently losing her job and her PCP, and has been feeling anxious as well.  Nothing makes her symptoms better nor worse.    She describes her symptoms as moderate.  She thinks something might be wrong with her body but she does not know what.  She denies fever/chills, night sweats, URI symptoms, chest pain, SOB, cough, N/V, abdominal pain, pelvic pain, vaginal bleeding, vaginal discharge.  She has been eating and drinking okay, but has not had much of an appetite.     Past Medical History:  Diagnosis Date  . Asthma     Patient Active Problem List   Diagnosis Date Noted  . Vaginal odor 10/10/2013    History reviewed. No pertinent surgical history.  Prior to Admission medications   Medication Sig Start Date End Date Taking? Authorizing Provider  cetirizine (ZYRTEC) 10 MG tablet Take 10 mg by mouth daily as needed for allergies.     Historical Provider, MD  ibuprofen (ADVIL,MOTRIN) 600 MG tablet Take 1 tablet (600 mg total) by mouth every 6 (six) hours as needed for mild pain or moderate pain. 09/20/14   Trixie Dredge, PA-C  ondansetron (ZOFRAN) 4 MG tablet Take 1 tablet (4 mg total) by mouth daily as needed for nausea or vomiting. 09/14/15   Emily Filbert, MD  oxymetazoline (AFRIN NASAL SPRAY) 0.05 % nasal spray Place 1 spray into both nostrils 2  (two) times daily as needed for congestion. X 3 days only 09/20/14   Trixie Dredge, PA-C  PRESCRIPTION MEDICATION Apply 1 application topically daily as needed (acne). Applied to face    Historical Provider, MD  pseudoephedrine (SUDAFED) 60 MG tablet Take 1 tablet (60 mg total) by mouth every 6 (six) hours as needed for congestion. 09/20/14   Trixie Dredge, PA-C    Allergies Patient has no known allergies.  Family History  Problem Relation Age of Onset  . Diabetes Maternal Uncle   . Hypertension Maternal Uncle   . Hypertension Maternal Aunt   . Prostate cancer Maternal Uncle     Social History Social History  Substance Use Topics  . Smoking status: Never Smoker  . Smokeless tobacco: Never Used  . Alcohol use Yes     Comment: occasionally     Review of Systems Constitutional: No fever/chills.  +Fatigue Eyes: No visual changes. ENT: No sore throat. Cardiovascular: Denies chest pain. Respiratory: Denies shortness of breath. Gastrointestinal: No abdominal pain.  No nausea, no vomiting.  No diarrhea.  No constipation.  Weight loss over the last month. Genitourinary: Negative for dysuria. Musculoskeletal: Negative for back pain. Skin: Negative for rash. Neurological: Negative for headaches, focal weakness or numbness.  10-point ROS otherwise negative.  ____________________________________________   PHYSICAL EXAM:  VITAL SIGNS: ED Triage Vitals  Enc Vitals Group     BP 02/25/16 1803 125/67     Pulse Rate 02/25/16 1803 (!) 103  Resp 02/25/16 1803 16     Temp 02/25/16 1803 97.8 F (36.6 C)     Temp Source 02/25/16 1803 Oral     SpO2 02/25/16 1803 100 %     Weight 02/25/16 1804 110 lb (49.9 kg)     Height 02/25/16 1804 5\' 4"  (1.626 m)     Head Circumference --      Peak Flow --      Pain Score 02/25/16 1803 0     Pain Loc --      Pain Edu? --      Excl. in GC? --     Constitutional: Alert and oriented. Well appearing and in no acute distress. Eyes: Conjunctivae are  normal. PERRL. EOMI. Head: Atraumatic. Nose: No congestion/rhinnorhea. Mouth/Throat: Mucous membranes are moist. Neck: No stridor.  No meningeal signs.   Cardiovascular: Normal rate, regular rhythm. Good peripheral circulation. Grossly normal heart sounds. Respiratory: Normal respiratory effort.  No retractions. Lungs CTAB. Gastrointestinal: Soft and nontender. No distention.  Musculoskeletal: No lower extremity tenderness nor edema. No gross deformities of extremities. Neurologic:  Normal speech and language. No gross focal neurologic deficits are appreciated.  Skin:  Skin is warm, dry and intact. No rash noted. Psychiatric: Mood and affect are normal. Speech and behavior are normal.  ____________________________________________   LABS (all labs ordered are listed, but only abnormal results are displayed)  Labs Reviewed  CBC - Abnormal; Notable for the following:       Result Value   WBC 11.1 (*)    All other components within normal limits  URINALYSIS, COMPLETE (UACMP) WITH MICROSCOPIC - Abnormal; Notable for the following:    Color, Urine YELLOW (*)    APPearance CLEAR (*)    Leukocytes, UA TRACE (*)    Squamous Epithelial / LPF 0-5 (*)    All other components within normal limits  COMPREHENSIVE METABOLIC PANEL  POC URINE PREG, ED  POCT PREGNANCY, URINE   ____________________________________________  EKG  None - EKG not ordered by ED physician ____________________________________________  RADIOLOGY   No results found.  ____________________________________________   PROCEDURES  Procedure(s) performed:   Procedures   Critical Care performed: No ____________________________________________   INITIAL IMPRESSION / ASSESSMENT AND PLAN / ED COURSE  Pertinent labs & imaging results that were available during my care of the patient were reviewed by me and considered in my medical decision making (see chart for details).  The patient has normal vital signs and  reassuring labs.  She has a normal hemoglobin.  She is calm, alert, and oriented.  I provided reassurance that her medical workup is normal today.  We discussed stress may play a role in her symptoms.  She felt better knowing that her workup was within normal limits today and was comfortable with the plan to follow up as an outpatient.  I provided her the number of the patient navigator as well as with [facility] in case she wants to talk to somebody about the stressors she is currently experiencing.  I gave my usual and customary return precautions.         ____________________________________________  FINAL CLINICAL IMPRESSION(S) / ED DIAGNOSES  Final diagnoses:  Fatigue, unspecified type     MEDICATIONS GIVEN DURING THIS VISIT:  Medications - No data to display   NEW OUTPATIENT MEDICATIONS STARTED DURING THIS VISIT:  Discharge Medication List as of 02/25/2016 11:16 PM      Discharge Medication List as of 02/25/2016 11:16 PM      Discharge  Medication List as of 02/25/2016 11:16 PM       Note:  This document was prepared using Dragon voice recognition software and may include unintentional dictation errors.    Loleta Rose, MD 02/26/16 [phone]

## 2016-02-25 NOTE — ED Triage Notes (Signed)
Patient presents to the ED with multiple symptoms over the past couple of days.  Patient states, "I know my body and something is wrong.  I think I might be anemic or something."  Patient reports intermittent tremors in her hands, diarrhea x 2 days, and slight headache.  Patient denies abdominal pain, vomiting, fever and cough.  Patient reports 3 episodes of "soft stools" in the past 24 hours.  Patient states, "I've been drinking a lot of green tea."  Patient states that she hasn't been very hungry or has been eating enough for about 1 week.  Patient reports unintentionally losing approx. 10 lbs in the past month.

## 2016-02-25 NOTE — Discharge Instructions (Signed)
As we discussed, your workup today was reassuring.  Though we do not know exactly what is causing your symptoms, it appears that you have no emergent medical condition at this time and that you are safe to go home and follow up as recommended in this paperwork. ° °Please return immediately to the Emergency Department if you develop any new or worsening symptoms that concern you. ° °

## 2016-08-08 ENCOUNTER — Emergency Department
Admission: EM | Admit: 2016-08-08 | Discharge: 2016-08-08 | Disposition: A | Discharge status: Home / Self Care | Payer: Medicaid Other | Attending: Emergency Medicine | Admitting: Emergency Medicine

## 2016-08-08 DIAGNOSIS — M6281 Muscle weakness (generalized): Secondary | ICD-10-CM | POA: Insufficient documentation

## 2016-08-08 DIAGNOSIS — R531 Weakness: Secondary | ICD-10-CM

## 2016-08-08 DIAGNOSIS — R5383 Other fatigue: Secondary | ICD-10-CM | POA: Insufficient documentation

## 2016-08-08 DIAGNOSIS — R634 Abnormal weight loss: Secondary | ICD-10-CM | POA: Insufficient documentation

## 2016-08-08 DIAGNOSIS — J45909 Unspecified asthma, uncomplicated: Secondary | ICD-10-CM | POA: Insufficient documentation

## 2016-08-08 LAB — URINALYSIS, ROUTINE W REFLEX MICROSCOPIC
Bilirubin Urine: NEGATIVE
Glucose, UA: NEGATIVE mg/dL
Hgb urine dipstick: NEGATIVE
KETONES UR: NEGATIVE mg/dL
LEUKOCYTES UA: NEGATIVE
Nitrite: NEGATIVE
PROTEIN: NEGATIVE mg/dL
Specific Gravity, Urine: 1.024 (ref 1.005–1.030)
pH: 6 (ref 5.0–8.0)

## 2016-08-08 LAB — CBC
HCT: 42.7 % (ref 35.0–47.0)
HEMOGLOBIN: 14.2 g/dL (ref 12.0–16.0)
MCH: 29.7 pg (ref 26.0–34.0)
MCHC: 33.2 g/dL (ref 32.0–36.0)
MCV: 89.3 fL (ref 80.0–100.0)
Platelets: 182 10*3/uL (ref 150–440)
RBC: 4.78 MIL/uL (ref 3.80–5.20)
RDW: 13.1 % (ref 11.5–14.5)
WBC: 8.1 10*3/uL (ref 3.6–11.0)

## 2016-08-08 LAB — COMPREHENSIVE METABOLIC PANEL
ALK PHOS: 50 U/L (ref 38–126)
ALT: 13 U/L — ABNORMAL LOW (ref 14–54)
ANION GAP: 9 (ref 5–15)
AST: 18 U/L (ref 15–41)
Albumin: 4.4 g/dL (ref 3.5–5.0)
BUN: 14 mg/dL (ref 6–20)
CALCIUM: 9.5 mg/dL (ref 8.9–10.3)
CO2: 27 mmol/L (ref 22–32)
Chloride: 100 mmol/L — ABNORMAL LOW (ref 101–111)
Creatinine, Ser: 0.99 mg/dL (ref 0.44–1.00)
GFR calc non Af Amer: >60 mL/min (ref >60)
Glucose, Bld: 79 mg/dL (ref 65–99)
Potassium: 3.4 mmol/L — ABNORMAL LOW (ref 3.5–5.1)
SODIUM: 136 mmol/L (ref 135–145)
Total Bilirubin: 0.4 mg/dL (ref 0.3–1.2)
Total Protein: 7.7 g/dL (ref 6.5–8.1)

## 2016-08-08 LAB — POCT PREGNANCY, URINE: Preg Test, Ur: NEGATIVE

## 2016-08-08 NOTE — ED Provider Notes (Signed)
[facility] Emergency Department Provider Note  Time seen: 6:54 PM  I have reviewed the triage vital signs and the nursing notes.   HISTORY  Chief Complaint Anorexia; Weight Loss; and Fatigue    HPI Laura Shaffer is a 26 y.o. female with a past medical history of asthma who presents to the emergency department with complaints of weight loss and not feeling well. According to the patient for the past several weeks she has been feeling weak, reports weight loss other states she has not been weighing herself. Denies any nausea vomiting or diarrhea. States she has not been eating as much for some reason. Denies any headache, focal weakness or numbness. Denies any fever, chest pain or abdominal pain. Denies any vaginal discharge or bleeding. Patient states she went to the health Department last week after having unprotected sex and was tested she states for HIV and STDs. Denies any symptoms of STDs currently. Overall the patient appears well, no distress, calm and cooperative. Largely negative review of systems.  Past Medical History:  Diagnosis Date  . Asthma     Patient Active Problem List   Diagnosis Date Noted  . Vaginal odor 10/10/2013    History reviewed. No pertinent surgical history.  Prior to Admission medications   Medication Sig Start Date End Date Taking? Authorizing Provider  cetirizine (ZYRTEC) 10 MG tablet Take 10 mg by mouth daily as needed for allergies.     [provider]  ibuprofen (ADVIL,MOTRIN) 600 MG tablet Take 1 tablet (600 mg total) by mouth every 6 (six) hours as needed for mild pain or moderate pain. 09/20/14   Trixie DredgeWest, Emily, PA-C  ondansetron (ZOFRAN) 4 MG tablet Take 1 tablet (4 mg total) by mouth daily as needed for nausea or vomiting. 09/14/15   Emily FilbertWilliams, Jonathan E, MD  oxymetazoline (AFRIN NASAL SPRAY) 0.05 % nasal spray Place 1 spray into both nostrils 2 (two) times daily as needed for congestion. X 3 days only 09/20/14    Trixie DredgeWest, Emily, PA-C  PRESCRIPTION MEDICATION Apply 1 application topically daily as needed (acne). Applied to face    [provider]  pseudoephedrine (SUDAFED) 60 MG tablet Take 1 tablet (60 mg total) by mouth every 6 (six) hours as needed for congestion. 09/20/14   Trixie DredgeWest, Emily, PA-C    No Known Allergies  Family History  Problem Relation Age of Onset  . Diabetes Maternal Uncle   . Hypertension Maternal Uncle   . Hypertension Maternal Aunt   . Prostate cancer Maternal Uncle     Social History Social History  Substance Use Topics  . Smoking status: Never Smoker  . Smokeless tobacco: Never Used  . Alcohol use Yes     Comment: occasionally     Review of Systems Constitutional: Negative for fever.Positive for generalized fatigue. ENT: Negative for congestion Cardiovascular: Negative for chest pain. Respiratory: Negative for shortness of breath. Gastrointestinal: Negative for abdominal pain, vomiting and diarrhea. Genitourinary: Negative for dysuria. Negative for vaginal discharge or bleeding Musculoskeletal: Negative for leg pain or swelling. Neurological: Negative for headache All other ROS negative  ____________________________________________   PHYSICAL EXAM:  VITAL SIGNS: ED Triage Vitals  Enc Vitals Group     BP 08/08/16 1636 (!) 122/55     Pulse Rate 08/08/16 1636 84     Resp 08/08/16 1636 18     Temp 08/08/16 1636 98.6 F (37 C)     Temp Source 08/08/16 1636 Oral     SpO2 08/08/16 1636  100 %     Weight 08/08/16 1637 120 lb (54.4 kg)     Height 08/08/16 1637 5\' 4"  (1.626 m)     Head Circumference --      Peak Flow --      Pain Score --      Pain Loc --      Pain Edu? --      Excl. in GC? --     Constitutional: Alert and oriented. Well appearing and in no distress. Eyes: Normal exam ENT   Head: Normocephalic and atraumatic.   Mouth/Throat: Mucous membranes are moist. Cardiovascular: Normal rate, regular rhythm. No murmur Respiratory:  Normal respiratory effort without tachypnea nor retractions. Breath sounds are clear  Gastrointestinal: Soft and nontender. No distention.   Musculoskeletal: Nontender with normal range of motion in all extremities.  Neurologic:  Normal speech and language. No gross focal neurologic deficits  Skin:  Skin is warm, dry and intact.  Psychiatric: Mood and affect are normal.   ____________________________________________    INITIAL IMPRESSION / ASSESSMENT AND PLAN / ED COURSE  Pertinent labs & imaging results that were available during my care of the patient were reviewed by me and considered in my medical decision making (see chart for details).  Patient presents in the emergency department for generalized weakness, reported weight loss although has not been weighing herself over the past several weeks. Patient states she was recently seen at the health Department for STD and HIV testing which according to the patient was negative. Patient states she just wanted to be checked out because she is a "hypochondriac." Patient's review of systems is largely negative. However given the patient's generalized fatigue/weakness we will check basic labs including urine pregnancy. Overall she appears very well no significant findings on physical exam.  patient's labs are largely within normal limits including urinalysis, negative pregnancy test. We will discharge home with PCP follow-up. ____________________________________________   FINAL CLINICAL IMPRESSION(S) / ED DIAGNOSES  Generalized weakness    Minna Antis, MD 08/08/16 2022

## 2016-08-08 NOTE — ED Triage Notes (Signed)
Pt reports that for last few months she feels that she has been losing weight and has lost her appetite. Reports generalized fatigue for months. Pt reports that she wants to be tested to find out what is wrong with her. No NVD, no CP, no SOB. Pt alert and oriented X4, active, cooperative, pt in NAD. RR even and unlabored, color WNL.

## 2016-09-13 ENCOUNTER — Encounter: Payer: Self-pay | Admitting: Certified Nurse Midwife

## 2016-09-13 ENCOUNTER — Ambulatory Visit (INDEPENDENT_AMBULATORY_CARE_PROVIDER_SITE_OTHER): Payer: Self-pay | Admitting: Certified Nurse Midwife

## 2016-09-13 VITALS — BP 111/65 | HR 77 | Ht 65.0 in | Wt 109.6 lb

## 2016-09-13 DIAGNOSIS — L709 Acne, unspecified: Secondary | ICD-10-CM

## 2016-09-13 DIAGNOSIS — R5383 Other fatigue: Secondary | ICD-10-CM

## 2016-09-13 DIAGNOSIS — J029 Acute pharyngitis, unspecified: Secondary | ICD-10-CM

## 2016-09-13 DIAGNOSIS — H9201 Otalgia, right ear: Secondary | ICD-10-CM

## 2016-09-13 MED ORDER — DROSPIRENONE-ETHINYL ESTRADIOL 3-0.02 MG PO TABS: 1.0000 | ORAL_TABLET | Freq: Every day | ORAL | 11 refills | Status: DC

## 2016-09-13 MED ORDER — DM-GUAIFENESIN ER 30-600 MG PO TB12: 1.0000 | ORAL_TABLET | Freq: Two times a day (BID) | ORAL | 0 refills | Status: DC

## 2016-09-13 NOTE — Patient Instructions (Signed)
Fatigue Fatigue is feeling tired all of the time, a lack of energy, or a lack of motivation. Occasional or mild fatigue is often a normal response to activity or life in general. However, long-lasting (chronic) or extreme fatigue may indicate an underlying medical condition. Follow these instructions at home: Watch your fatigue for any changes. The following actions may help to lessen any discomfort you are feeling:  Talk to your health care provider about how much sleep you need each night. Try to get the required amount every night.  Take medicines only as directed by your health care provider.  Eat a healthy and nutritious diet. Ask your health care provider if you need help changing your diet.  Drink enough fluid to keep your urine clear or pale yellow.  Practice ways of relaxing, such as yoga, meditation, massage therapy, or acupuncture.  Exercise regularly.  Change situations that cause you stress. Try to keep your work and personal routine reasonable.  Do not abuse illegal drugs.  Limit alcohol intake to no more than 1 drink per day for nonpregnant women and 2 drinks per day for men. One drink equals 12 ounces of beer, 5 ounces of wine, or 1 ounces of hard liquor.  Take a multivitamin, if directed by your health care provider.  Contact a health care provider if:  Your fatigue does not get better.  You have a fever.  You have unintentional weight loss or gain.  You have headaches.  You have difficulty: ? Falling asleep. ? Sleeping throughout the night.  You feel angry, guilty, anxious, or sad.  You are unable to have a bowel movement (constipation).  You skin is dry.  Your legs or another part of your body is swollen. Get help right away if:  You feel confused.  Your vision is blurry.  You feel faint or pass out.  You have a severe headache.  You have severe abdominal, pelvic, or back pain.  You have chest pain, shortness of breath, or an irregular or  fast heartbeat.  You are unable to urinate or you urinate less than normal.  You develop abnormal bleeding, such as bleeding from the rectum, vagina, nose, lungs, or nipples.  You vomit blood.  You have thoughts about harming yourself or committing suicide.  You are worried that you might harm someone else. This information is not intended to replace advice given to you by your health care provider. Make sure you discuss any questions you have with your health care provider. Document Released: 10/31/2006 Document Revised: 06/11/2015 Document Reviewed: 05/07/2013 Elsevier Interactive Patient Education  2018 [employer]. Acne Acne is a skin problem that causes pimples. Acne occurs when the pores in the skin get blocked. The pores may become infected with bacteria, or they may become red, sore, and swollen. Acne is a common skin problem, especially for teenagers. Acne usually goes away over time. What are the causes? Each pore contains an oil gland. Oil glands make an oily substance that is called sebum. Acne happens when these glands get plugged with sebum, dead skin cells, and dirt. Then, the bacteria that are normally found in the oil glands multiply and cause inflammation. Acne is commonly triggered by changes in your hormones. These hormonal changes can cause the oil glands to get bigger and to make more sebum. Factors that can make acne worse include:  Hormone changes during: ? Adolescence. ? Women's menstrual cycles. ? Pregnancy.  Oil-based cosmetics and hair products.  Harshly scrubbing the skin.  Strong soaps.  Stress.  Hormone problems that are due to certain diseases.  Long or oily hair rubbing against the skin.  Certain medicines.  Pressure from headbands, backpacks, or shoulder pads.  Exposure to certain oils and chemicals.  What increases the risk? This condition is more likely to develop in:  Teenagers.  People who have a family history of acne.  What  are the signs or symptoms? Acne often occurs on the face, neck, chest, and upper back. Symptoms include:  Small, red bumps (pimples or papules).  Whiteheads.  Blackheads.  Small, pus-filled pimples (pustules).  Big, red pimples or pustules that feel tender.  More severe acne can cause:  An infected area that contains a collection of pus (abscess).  Hard, painful, fluid-filled sacs (cysts).  Scars.  How is this diagnosed? This condition is diagnosed with a medical history and physical exam. Blood tests may also be done. How is this treated? Treatment for this condition can vary depending on the severity of your acne. Treatment may include:  Creams and lotions that prevent oil glands from clogging.  Creams and lotions that treat or prevent infections and inflammation.  Antibiotic medicines that are applied to the skin or taken as a pill.  Pills that decrease sebum production.  Birth control pills.  Light or laser treatments.  Surgery.  Injections of medicine into the affected areas.  Chemicals that cause peeling of the skin.  Your health care provider will also recommend the best way to take care of your skin. Good skin care is the most important part of treatment. Follow these instructions at home: Skin care Take care of your skin as told by your health care provider. You may be told to do these things:  Wash your skin gently at least two times each day, as well as: ? After you exercise. ? Before you go to bed.  Use mild soap.  Apply a water-based skin moisturizer after you wash your skin.  Use a sunscreen or sunblock with SPF 30 or greater. This is especially important if you are using acne medicines.  Choose cosmetics that will not plug your oil glands (are noncomedogenic).  Medicines  Take over-the-counter and prescription medicines only as told by your health care provider.  If you were prescribed an antibiotic medicine, apply or take it as told by  your health care provider. Do not stop taking the antibiotic even if your condition improves. General instructions  Keep your hair clean and off of your face. If you have oily hair, shampoo your hair regularly or daily.  Avoid leaning your chin or forehead against your hands.  Avoid wearing tight headbands or hats.  Avoid picking or squeezing your pimples. That can make your acne worse and cause scarring.  Keep all follow-up visits as told by your health care provider. This is important.  Shave gently and only when necessary.  Keep a food journal to figure out if any foods are linked with your acne. Contact a health care provider if:  Your acne is not better after eight weeks.  Your acne gets worse.  You have a large area of skin that is red or tender.  You think that you are having side effects from any acne medicine. This information is not intended to replace advice given to you by your health care provider. Make sure you discuss any questions you have with your health care provider. Document Released: 01/01/2000 Document Revised: 09/04/2015 Document Reviewed: 03/12/2014 Elsevier Interactive Patient Education  2018 [employer] Drospirenone; Ethinyl Estradiol tablets What is this medicine? DROSPIRENONE; ETHINYL ESTRADIOL (dro SPY re nown; ETH in il es tra DYE ole) is an oral contraceptive (birth control pill). This medicine combines two types of female hormones, an estrogen and a progestin. It is used to prevent ovulation and pregnancy. This medicine may be used for other purposes; ask your health care provider or pharmacist if you have questions. COMMON BRAND NAME(S): Evelena Peat What should I tell my health care provider before I take this medicine? They need to know if you have or ever had any of these conditions: -abnormal vaginal bleeding -adrenal gland disease -blood vessel disease or blood clots -breast,  cervical, endometrial, ovarian, liver, or uterine cancer -diabetes -gallbladder disease -heart disease or recent heart attack -high blood pressure -high cholesterol -high potassium level -kidney disease -liver disease -migraine headaches -stroke -systemic lupus erythematosus (SLE) -tobacco smoker -an unusual or allergic reaction to estrogens, progestins, or other medicines, foods, dyes, or preservatives -pregnant or trying to get pregnant -breast-feeding How should I use this medicine? Take this medicine by mouth. To reduce nausea, this medicine may be taken with food. Follow the directions on the prescription label. Take this medicine at the same time each day and in the order directed on the package. Do not take your medicine more often than directed. A patient package insert for the product will be given with each prescription and refill. Read this sheet carefully each time. The sheet may change frequently. Talk to your pediatrician regarding the use of this medicine in children. Special care may be needed. This medicine has been used in female children who have started having menstrual periods. Overdosage: If you think you have taken too much of this medicine contact a poison control center or emergency room at once. NOTE: This medicine is only for you. Do not share this medicine with others. What if I miss a dose? If you miss a dose, refer to the patient information sheet you received with your medicine for direction. If you miss more than one pill, this medicine may not be as effective and you may need to use another form of birth control. What may interact with this medicine? Do not take this medicine with any of the following medications: -aminoglutethimide -amprenavir, fosamprenavir -atazanavir; cobicistat -anastrozole -bosentan -exemestane -letrozole -metyrapone -testolactone This medicine may also interact with the following medications: -acetaminophen -antiviral  medicines for HIV or AIDS -aprepitant -barbiturates -certain antibiotics like rifampin, rifabutin, rifapentine, and possibly penicillins or tetracyclines -certain diuretics like amiloride, spironolactone, triamterene -certain medicines for fungal infections like griseofulvin, ketoconazole, itraconazole -certain medications for high blood pressure or heart conditions like ACE-inhibitors, Angiotensin-II receptor blockers, eplerenone -certain medicines for seizures like carbamazepine, oxcarbazepine, phenobarbital, phenytoin -cholestyramine -cobicistat -corticosteroid like hydrocortisone and prednisolone -cyclosporine -dantrolene -felbamate -grapefruit juice -heparin -lamotrigine -medicines for diabetes, including pioglitazone -modafinil -NSAIDs -potassium supplements -pyrimethamine -raloxifene -St. John's wort -sulfasalazine -tamoxifen -topiramate -thyroid hormones -warfarin his list may not describe all possible interactions. Give your health care provider a list of all the medicines, herbs, non-prescription drugs, or dietary supplements you use. Also tell them if you smoke, drink alcohol, or use illegal drugs. Some items may interact with your medicine. This list may not describe all possible interactions. Give your health care provider a list of all the medicines, herbs, non-prescription drugs, or dietary supplements you use. Also tell them if you smoke, drink alcohol, or use illegal drugs. Some items may  interact with your medicine. What should I watch for while using this medicine? Visit your doctor or health care professional for regular checks on your progress. You will need a regular breast and pelvic exam and Pap smear while on this medicine. Use an additional method of contraception during the first cycle that you take these tablets. If you have any reason to think you are pregnant, stop taking this medicine right away and contact your doctor or health care professional. If  you are taking this medicine for hormone related problems, it may take several cycles of use to see improvement in your condition. Smoking increases the risk of getting a blood clot or having a stroke while you are taking birth control pills, especially if you are more than 25 years old. You are strongly advised not to smoke. This medicine can make your body retain fluid, making your fingers, hands, or ankles swell. Your blood pressure can go up. Contact your doctor or health care professional if you feel you are retaining fluid. This medicine can make you more sensitive to the sun. Keep out of the sun. If you cannot avoid being in the sun, wear protective clothing and use sunscreen. Do not use sun lamps or tanning beds/booths. If you wear contact lenses and notice visual changes, or if the lenses begin to feel uncomfortable, consult your eye care specialist. In some women, tenderness, swelling, or minor bleeding of the gums may occur. Notify your dentist if this happens. Brushing and flossing your teeth regularly may help limit this. See your dentist regularly and inform your dentist of the medicines you are taking. If you are going to have elective surgery, you may need to stop taking this medicine before the surgery. Consult your health care professional for advice. This medicine does not protect you against HIV infection (AIDS) or any other sexually transmitted diseases. What side effects may I notice from receiving this medicine? Side effects that you should report to your doctor or health care professional as soon as possible: -allergic reactions like skin rash, itching or hives, swelling of the face, lips, or tongue -breast tissue changes or discharge -changes in vision -chest pain -confusion, trouble speaking or understanding -dark urine -general ill feeling or flu-like symptoms -light-colored stools -nausea, vomiting -pain, swelling, warmth in the leg -right upper belly pain -severe  headaches -shortness of breath -sudden numbness or weakness of the face, arm or leg -trouble walking, dizziness, loss of balance or coordination -unusual vaginal bleeding -yellowing of the eyes or skin Side effects that usually do not require medical attention (report to your doctor or health care professional if they continue or are bothersome): -acne -brown spots on the face -change in appetite -change in sexual desire -depressed mood or mood swings -fluid retention and swelling -stomach cramps or bloating -unusually weak or tired -weight gain This list may not describe all possible side effects. Call your doctor for medical advice about side effects. You may report side effects to FDA at 1-800-FDA-1088. Where should I keep my medicine? Keep out of the reach of children. Store at room temperature between 15 and 30 degrees C (59 and 86 degrees F). Throw away any unused medicine after the expiration date. NOTE: This sheet is a summary. It may not cover all possible information. If you have questions about this medicine, talk to your doctor, pharmacist, or health care provider.  2018 Elsevier/Gold Standard (2015-09-25 13:52:56)

## 2016-09-14 LAB — TESTOSTERONE, FREE, TOTAL, SHBG
SEX HORMONE BINDING: 91.5 nmol/L (ref 24.6–122.0)
TESTOSTERONE FREE: 1.1 pg/mL (ref 0.0–4.2)
TESTOSTERONE: 32 ng/dL (ref 8–48)

## 2016-09-14 LAB — THYROID PANEL WITH TSH
FREE THYROXINE INDEX: 2.6 (ref 1.2–4.9)
T3 Uptake Ratio: 29 % (ref 24–39)
T4, Total: 8.9 ug/dL (ref 4.5–12.0)
TSH: 0.869 u[IU]/mL (ref 0.450–4.500)

## 2016-09-14 LAB — DHEA-SULFATE: DHEA SO4: 328 ug/dL (ref 84.8–378.0)

## 2016-09-14 LAB — FSH/LH
FSH: 4.5 m[IU]/mL
LH: 7 m[IU]/mL

## 2016-09-16 ENCOUNTER — Encounter: Payer: Self-pay | Admitting: Certified Nurse Midwife

## 2016-09-20 NOTE — Progress Notes (Signed)
GYN ENCOUNTER NOTE  Subjective:       Laura Shaffer is a 25 y.o. [id number] female here for gynecologic evaluation of acne, fatigue, weight loss,  thinning hair and right ear pain over the last few months.   No relief with home treatment measures or dermatologic solutions.   Denies difficulty breathing or respiratory distress, chest pain, abdominal pain, excessive leg pain or swelling.    Gynecologic History  Patient's last menstrual period was 08/28/2016 (exact date).  Contraception: condoms  Last Pap: 07/2016. Results were: normal  Menstrual History  Period Duration (Days): Three (3) to four (4) Period Pattern: Regular Menstrual Flow: Heavy Menstrual Control: Maxi pad Menstrual Control Change Freq (Hours): Two (2) to three (3) Dysmenorrhea: None  Obstetric History  OB History  Gravida Para Term Preterm AB Living  1       1 0  SAB TAB Ectopic Multiple Live Births  1            # Outcome Date GA Lbr Len/2nd Weight Sex Delivery Anes PTL Lv  1 SAB               Past Medical History:  Diagnosis Date  . Asthma     History reviewed. No pertinent surgical history.  Current Outpatient Prescriptions on File Prior to Visit  Medication Sig Dispense Refill  . ibuprofen (ADVIL,MOTRIN) 600 MG tablet Take 1 tablet (600 mg total) by mouth every 6 (six) hours as needed for mild pain or moderate pain. 15 tablet 0  . oxymetazoline (AFRIN NASAL SPRAY) 0.05 % nasal spray Place 1 spray into both nostrils 2 (two) times daily as needed for congestion. X 3 days only 30 mL 0  . PRESCRIPTION MEDICATION Apply 1 application topically daily as needed (acne). Applied to face     No current facility-administered medications on file prior to visit.     No Known Allergies  Social History   Social History  . Marital status: Single    Spouse name: N/A  . Number of children: N/A  . Years of education: N/A   Occupational History  . Not on file.   Social History Main Topics  . Smoking  status: Never Smoker  . Smokeless tobacco: Never Used  . Alcohol use Yes     Comment: occasionally   . Drug use: Yes    Types: Marijuana     Comment: occasionally, not like she used too  . Sexual activity: Yes    Partners: Male    Birth control/ protection: Condom   Other Topics Concern  . Not on file   Social History Narrative  . No narrative on file    Family History  Problem Relation Age of Onset  . Diabetes Maternal Uncle   . Hypertension Maternal Uncle   . Hypertension Maternal Aunt   . Prostate cancer Maternal Uncle   . Sickle cell trait Other     The following portions of the patient's history were reviewed and updated as appropriate: allergies, current medications, past family history, past medical history, past social history, past surgical history and problem list.  Review of Systems  Review of Systems - Negative except as noted above.  History obtained from the patient.   Objective:   BP 111/65   Pulse 77   Ht 5\' 5"  (1.651 m)   Wt 109 lb 9.6 oz (49.7 kg)   LMP 08/28/2016 (Exact Date)   BMI 18.24 kg/m    CONSTITUTIONAL: Well-developed, well-nourished female in  no acute distress.   HENT:  Normocephalic, atraumatic. Bilateral tympanic membranes within normal limits.  NECK: Normal range of motion, supple, no masses.  Normal thyroid.   SKIN: Skin is warm and dry. No rash noted. Not diaphoretic. No erythema. No pallor.  NEUROLGIC: Alert and oriented to person, place, and time.   PSYCHIATRIC: Normal mood and affect. Normal behavior. Normal judgment and thought content.  CARDIOVASCULAR: Regular rate and rhythm noted.   RESPIRATORY: Lungs clear to auscultation bilaterally.   BREASTS: Not Examined  ABDOMEN: Not Examined  PELVIC: Not Examined   MUSCULOSKELETAL: Normal range of motion. No tenderness.  No cyanosis, clubbing, or edema.  Assessment:   1. Acne, unspecified acne type  - Testosterone, Free, Total, SHBG - DHEA-sulfate - FSH/LH  2.  Fatigue, unspecified type  - Thyroid Panel With TSH  3. Ear pain, right  - Ambulatory referral to Allergy  4. Sore throat  - Ambulatory referral to Allergy  Plan:   Labs: Thyroid panel, DHEA-S, FSH/LH, and testerone; see orders.   Referral to Allergy  Rx: Yaz, see orders.   Reviewed red flag symptoms and when to call.   RTC if symptoms worsen or fail to improve.    Gunnar BullaJenkins Michelle Terril Chestnut, CNM

## 2016-09-22 ENCOUNTER — Telehealth: Payer: Self-pay | Admitting: Certified Nurse Midwife

## 2016-09-22 NOTE — Telephone Encounter (Signed)
Patient called and stated that she can not understand her results in her chart, The patient stated that she would like for Marcelino DusterMichelle to send her a MyChart message to explain what her results mean. The patient did not disclose any other information other than her not having a working phone number at the moment, but she does have access to [facility]. Please advise.

## 2016-09-24 ENCOUNTER — Encounter: Payer: Self-pay | Admitting: Certified Nurse Midwife

## 2016-09-26 ENCOUNTER — Telehealth: Payer: Self-pay

## 2016-09-26 NOTE — Telephone Encounter (Signed)
Called pt no answer. Unable to leave message as voicemail is full.  

## 2016-09-26 NOTE — Telephone Encounter (Signed)
Please contact patient and inform CMP was normal at ER visit in 07/2016, but may come in for fasting labs including CMP and A1c if desired. Thanks, JML

## 2016-09-27 NOTE — Telephone Encounter (Signed)
Message sent via MyChart explaining results as well as phone call attempted by Alba Coryki Glanton, CNM. Unable to leave message due to full mailbox. JML

## 2016-10-03 ENCOUNTER — Encounter: Payer: Self-pay | Admitting: Certified Nurse Midwife

## 2017-03-07 ENCOUNTER — Emergency Department
Admission: EM | Admit: 2017-03-07 | Discharge: 2017-03-07 | Disposition: A | Discharge status: Home / Self Care | Payer: Self-pay | Attending: Emergency Medicine | Admitting: Emergency Medicine

## 2017-03-07 ENCOUNTER — Encounter: Payer: Self-pay | Admitting: Emergency Medicine

## 2017-03-07 DIAGNOSIS — R531 Weakness: Secondary | ICD-10-CM

## 2017-03-07 DIAGNOSIS — J45909 Unspecified asthma, uncomplicated: Secondary | ICD-10-CM | POA: Insufficient documentation

## 2017-03-07 DIAGNOSIS — R5383 Other fatigue: Secondary | ICD-10-CM | POA: Insufficient documentation

## 2017-03-07 DIAGNOSIS — M6281 Muscle weakness (generalized): Secondary | ICD-10-CM | POA: Insufficient documentation

## 2017-03-07 LAB — BASIC METABOLIC PANEL
Anion gap: 9 (ref 5–15)
BUN: 14 mg/dL (ref 6–20)
CHLORIDE: 106 mmol/L (ref 101–111)
CO2: 25 mmol/L (ref 22–32)
CREATININE: 0.82 mg/dL (ref 0.44–1.00)
Calcium: 9.3 mg/dL (ref 8.9–10.3)
GFR calc Af Amer: >60 mL/min (ref >60)
GFR calc non Af Amer: >60 mL/min (ref >60)
GLUCOSE: 94 mg/dL (ref 65–99)
Potassium: 3.8 mmol/L (ref 3.5–5.1)
SODIUM: 140 mmol/L (ref 135–145)

## 2017-03-07 LAB — CBC
HCT: 40.3 % (ref 35.0–47.0)
Hemoglobin: 13.4 g/dL (ref 12.0–16.0)
MCH: 28.9 pg (ref 26.0–34.0)
MCHC: 33.2 g/dL (ref 32.0–36.0)
MCV: 87.1 fL (ref 80.0–100.0)
PLATELETS: 189 10*3/uL (ref 150–440)
RBC: 4.63 MIL/uL (ref 3.80–5.20)
RDW: 13.4 % (ref 11.5–14.5)
WBC: 8.4 10*3/uL (ref 3.6–11.0)

## 2017-03-07 LAB — URINALYSIS, COMPLETE (UACMP) WITH MICROSCOPIC
Bacteria, UA: NONE SEEN
Bilirubin Urine: NEGATIVE
Glucose, UA: NEGATIVE mg/dL
Hgb urine dipstick: NEGATIVE
Ketones, ur: NEGATIVE mg/dL
Leukocytes, UA: NEGATIVE
Nitrite: NEGATIVE
PH: 6 (ref 5.0–8.0)
Protein, ur: NEGATIVE mg/dL
SPECIFIC GRAVITY, URINE: 1.026 (ref 1.005–1.030)
WBC, UA: NONE SEEN WBC/hpf (ref 0–5)

## 2017-03-07 LAB — GLUCOSE, CAPILLARY: GLUCOSE-CAPILLARY: 58 mg/dL — AB (ref 65–99)

## 2017-03-07 NOTE — ED Provider Notes (Signed)
[facility] Emergency Department Provider Note  Time seen: 9:25 PM  I have reviewed the triage vital signs and the nursing notes.   HISTORY  Chief Complaint Polydipsia; Urinary Frequency; and Eczema    HPI Laura Shaffer is a 27 y.o. female with a past medical history of asthma who presents to the emergency department for concerns of generalized fatigue increased thirst and frequent urination.  According to the patient over the past several weeks she has been feeling very fatigued throughout the day, states she has been drinking a lot of water feeling very thirsty and peeing very frequently but denies any dysuria.  Patient states she was looking up on the Internet we will could be wrong and was concerned that she may have diabetes.  States her uncle has diabetes as her only known family history.  Patient does admit to little sleep recently states she only gets 5 or 6 hours of sleep per night which could be contributing to her fatigue per patient.  Largely negative review of systems otherwise.   Past Medical History:  Diagnosis Date  . Asthma     Patient Active Problem List   Diagnosis Date Noted  . Vaginal odor 10/10/2013    History reviewed. No pertinent surgical history.  Prior to Admission medications   Medication Sig Start Date End Date Taking? Authorizing Provider  dextromethorphan-guaiFENesin (MUCINEX DM) 30-600 MG 12hr tablet Take 1 tablet by mouth 2 (two) times daily. 09/13/16   Gunnar BullaLawhorn, Jenkins Michelle, CNM  drospirenone-ethinyl estradiol (YAZ) 3-0.02 MG tablet Take 1 tablet by mouth daily. 09/13/16   Gunnar BullaLawhorn, Jenkins Michelle, CNM  ibuprofen (ADVIL,MOTRIN) 600 MG tablet Take 1 tablet (600 mg total) by mouth every 6 (six) hours as needed for mild pain or moderate pain. 09/20/14   Trixie DredgeWest, Emily, PA-C  oxymetazoline (AFRIN NASAL SPRAY) 0.05 % nasal spray Place 1 spray into both nostrils 2 (two) times daily as needed for congestion. X 3 days only 09/20/14    Trixie DredgeWest, Emily, PA-C  PRESCRIPTION MEDICATION Apply 1 application topically daily as needed (acne). Applied to face    [provider]    No Known Allergies  Family History  Problem Relation Age of Onset  . Diabetes Maternal Uncle   . Hypertension Maternal Uncle   . Hypertension Maternal Aunt   . Prostate cancer Maternal Uncle   . Sickle cell trait Other     Social History Social History   Tobacco Use  . Smoking status: Never Smoker  . Smokeless tobacco: Never Used  Substance Use Topics  . Alcohol use: Yes    Comment: occasionally   . Drug use: Yes    Types: Marijuana    Comment: occasionally, not like she used too    Review of Systems Constitutional: Negative for fever.  Positive for generalized fatigue/weakness. Eyes: Negative for visual complaints ENT: Negative for recent illness/congestion Cardiovascular: Negative for chest pain. Respiratory: Negative for shortness of breath. Gastrointestinal: Negative for abdominal pain, vomiting and diarrhea. Genitourinary: Frequent urination but no dysuria. Musculoskeletal: Negative for musculoskeletal complaints Skin: Negative for skin complaints  Neurological: Negative for headache All other ROS negative  ____________________________________________   PHYSICAL EXAM:  VITAL SIGNS: ED Triage Vitals  Enc Vitals Group     BP 03/07/17 1827 105/78     Pulse Rate 03/07/17 1827 83     Resp 03/07/17 1827 20     Temp 03/07/17 1827 98.4 F (36.9 C)     Temp Source 03/07/17 1827 Oral  SpO2 03/07/17 1827 100 %     Weight 03/07/17 1828 109 lb (49.4 kg)     Height --      Head Circumference --      Peak Flow --      Pain Score --      Pain Loc --      Pain Edu? --      Excl. in GC? --    Constitutional: Alert and oriented. Well appearing and in no distress. Eyes: Normal exam ENT   Head: Normocephalic and atraumatic.   Mouth/Throat: Mucous membranes are moist. Cardiovascular: Normal rate, regular rhythm.  No murmur Respiratory: Normal respiratory effort without tachypnea nor retractions. Breath sounds are clear  Gastrointestinal: Soft and nontender. No distention.  Musculoskeletal: Nontender with normal range of motion in all extremities.  Neurologic:  Normal speech and language. No gross focal neurologic deficits  Skin:  Skin is warm, dry and intact.  Psychiatric: Mood and affect are normal.  ____________________________________________   INITIAL IMPRESSION / ASSESSMENT AND PLAN / ED COURSE  Pertinent labs & imaging results that were available during my care of the patient were reviewed by me and considered in my medical decision making (see chart for details).  Patient presents to the emergency department for fatigue increased thirst and frequent urination.  Differential would include new onset diabetes, generalized weakness, alleged letter metabolic abnormality, infectious etiology.  Overall the patient appears very well, very normal physical exam.  Largely negative review of systems.  Patient's lab work has resulted largely within normal limits as well.  Normal chemistry glucose.  Normal white blood cell count, no anion gap, normal urinalysis with a normal specific gravity.  I discussed with the patient getting plenty of rest, increasing her sleep to at least 7 or 8 hours per night and following up with her doctor.  Patient agreeable to this plan of care.  ____________________________________________   FINAL CLINICAL IMPRESSION(S) / ED DIAGNOSES  Generalized weakness    Minna Antis, MD 03/07/17 2127

## 2017-03-07 NOTE — ED Triage Notes (Signed)
Pt with increased thirst, frequent urination and when she eats a lot of sugar her skin itches.

## 2017-05-07 IMAGING — CR DG ABDOMEN 2V
1 series · 3 of 3 positions shown · non-contrast
Comparison: None.

CLINICAL DATA: Patient has been having midline abdominal pain for
over 1 week with diarrhea. She has had a decreased appetite.
Negative preg test.

EXAM:
ABDOMEN - 2 VIEW

[Series 1: dg abd 2 views · 0.14mm/px · 3 of 3 slices shown]
[im 1/3]
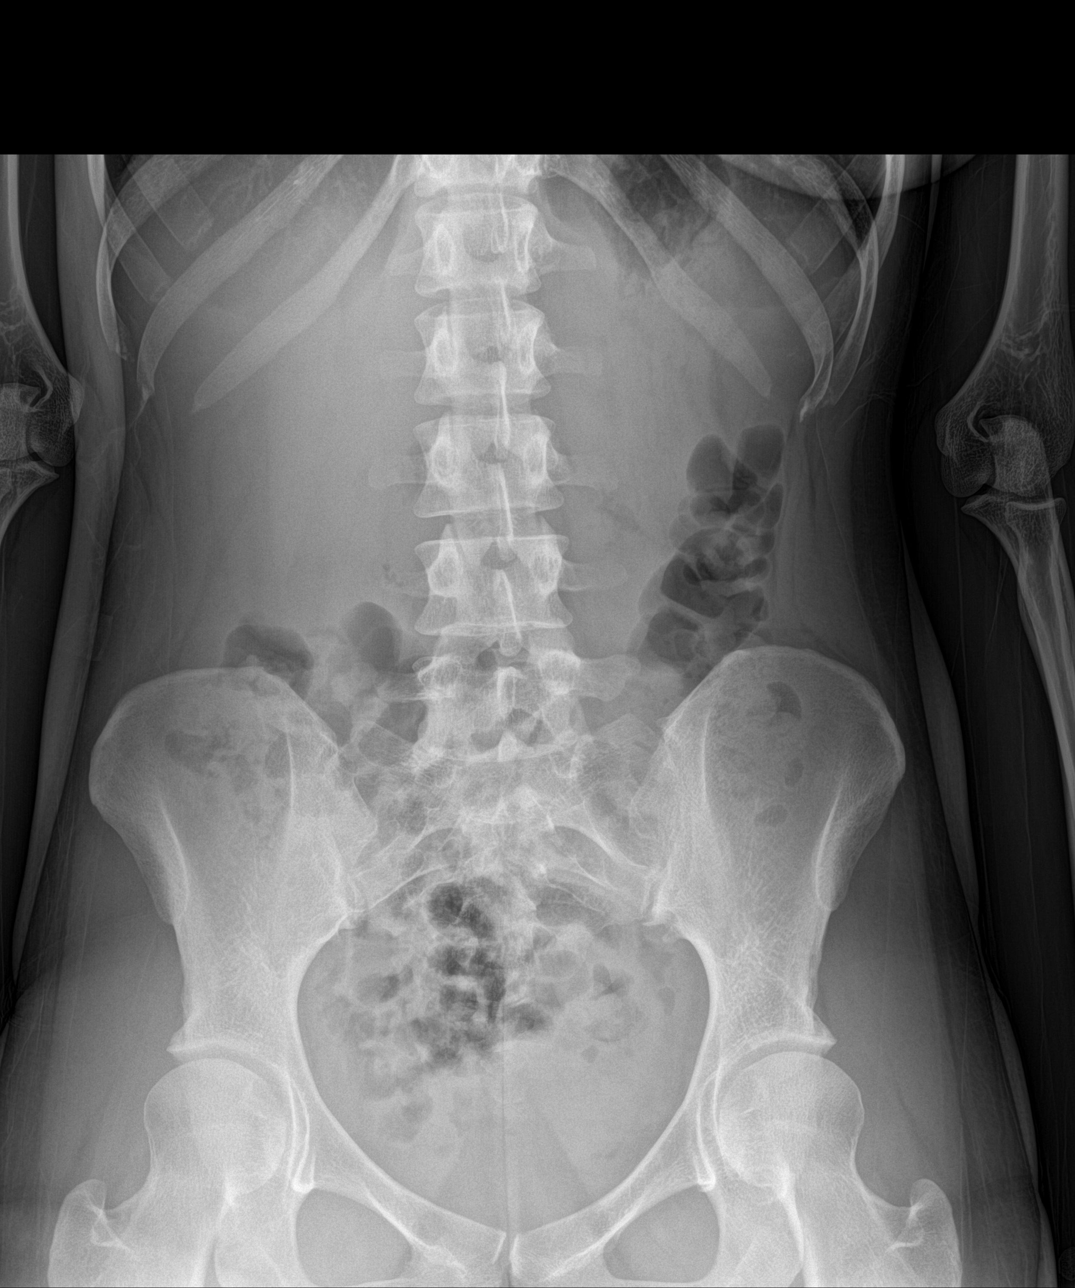
[im 2/3]
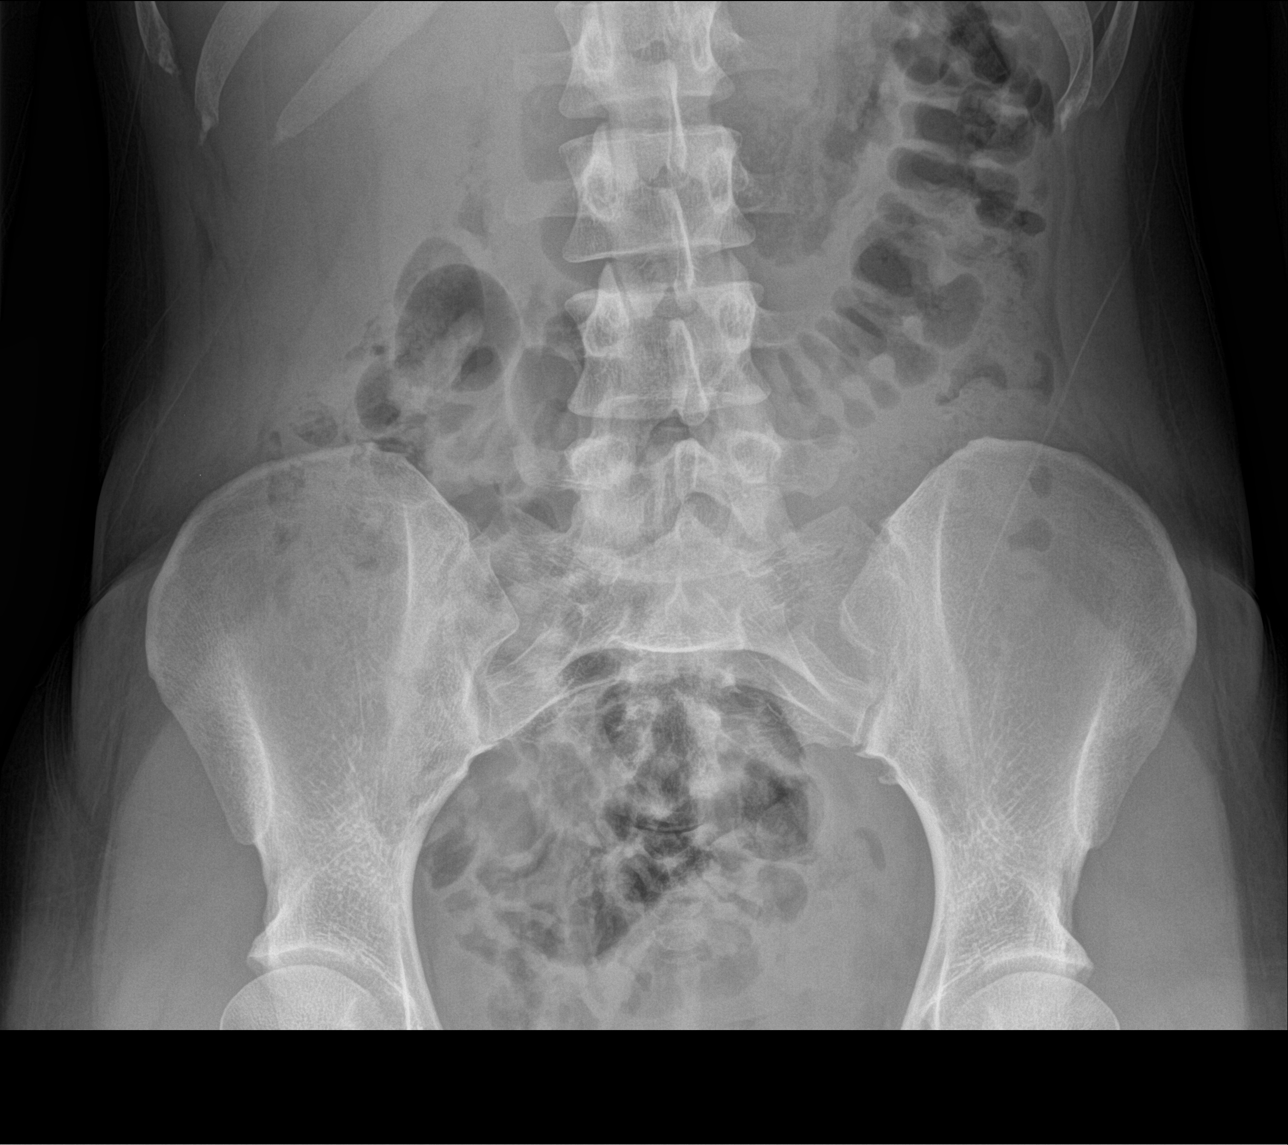
[im 3/3]
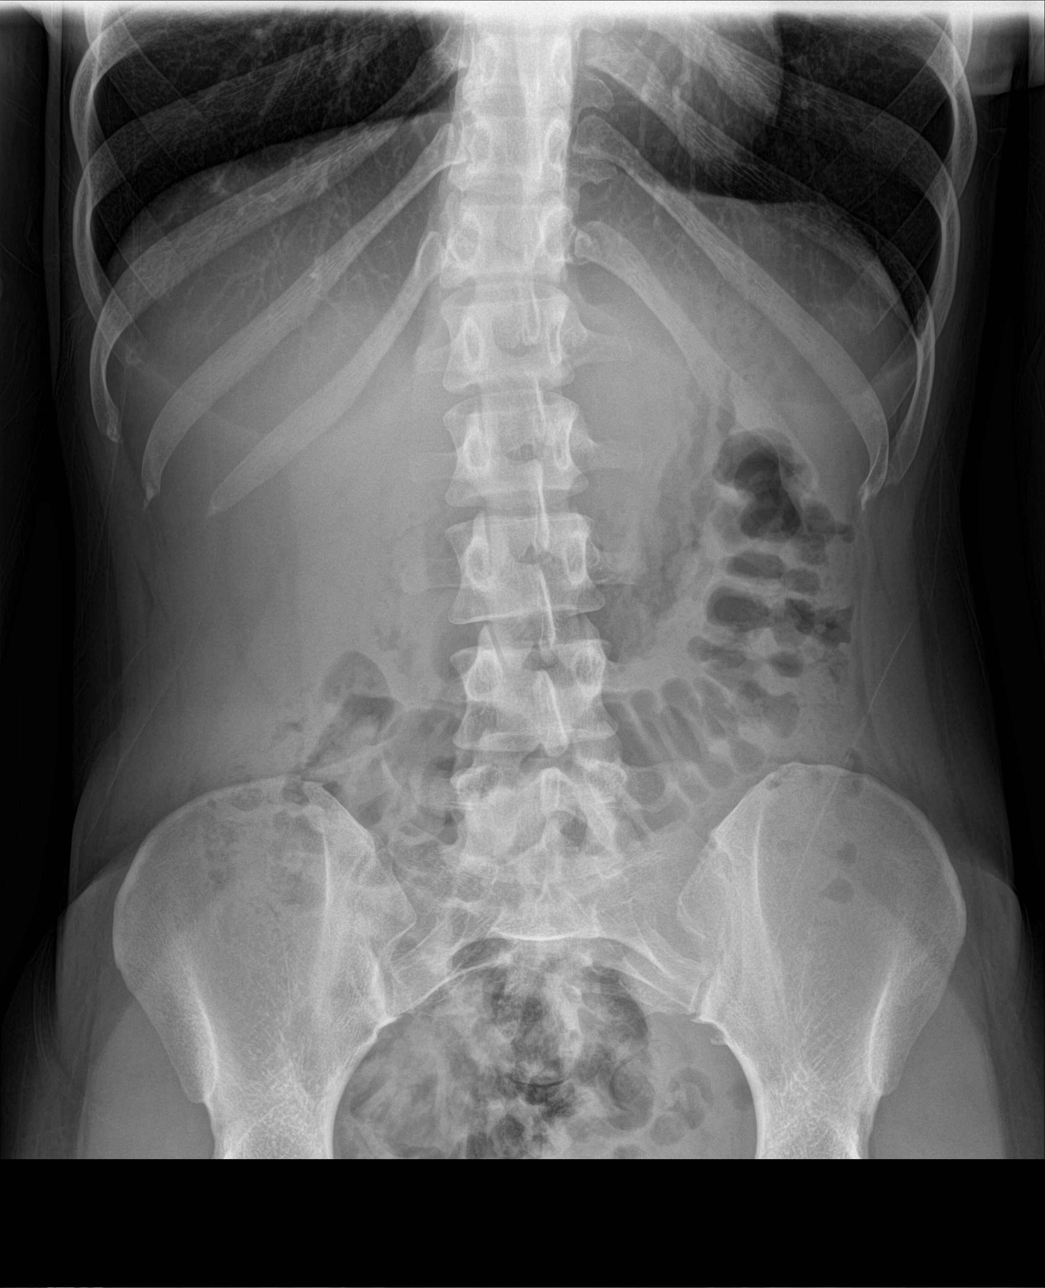

[3 of 3 positions shown; findings below may reference images not displayed]

FINDINGS: Scattered gas and stool in the colon. No small or large bowel
distention. No free intra-abdominal air. No abnormal air-fluid
levels. No radiopaque stones. Visualized bones appear intact.
IMPRESSION: Normal nonobstructive bowel gas pattern.

## 2017-09-27 ENCOUNTER — Inpatient Hospital Stay (HOSPITAL_COMMUNITY)
Admission: AD | Admit: 2017-09-27 | Discharge: 2017-09-27 | Disposition: A | Discharge status: Home / Self Care | Payer: Medicaid Other | Attending: Obstetrics & Gynecology | Admitting: Obstetrics & Gynecology

## 2017-09-29 ENCOUNTER — Other Ambulatory Visit: Payer: Self-pay

## 2017-09-29 ENCOUNTER — Encounter: Payer: Self-pay | Admitting: Emergency Medicine

## 2017-09-29 ENCOUNTER — Emergency Department
Admission: EM | Admit: 2017-09-29 | Discharge: 2017-09-29 | Disposition: A | Discharge status: Home / Self Care | Payer: Self-pay | Attending: Emergency Medicine | Admitting: Emergency Medicine

## 2017-09-29 DIAGNOSIS — J45909 Unspecified asthma, uncomplicated: Secondary | ICD-10-CM | POA: Insufficient documentation

## 2017-09-29 DIAGNOSIS — L243 Irritant contact dermatitis due to cosmetics: Secondary | ICD-10-CM | POA: Insufficient documentation

## 2017-09-29 DIAGNOSIS — Z79899 Other long term (current) drug therapy: Secondary | ICD-10-CM | POA: Insufficient documentation

## 2017-09-29 MED ORDER — TRIAMCINOLONE ACETONIDE 0.025 % EX OINT: 1.0000 "application " | TOPICAL_OINTMENT | Freq: Two times a day (BID) | CUTANEOUS | 0 refills | Status: DC

## 2017-09-29 NOTE — ED Notes (Signed)
See triage note  States she developed a rash under both arms about 2 weeks ago  Also states has general rash    Also has not been feeling well for a while  Also has had wt loss

## 2017-09-29 NOTE — ED Triage Notes (Signed)
Rash all over x2 days

## 2017-09-29 NOTE — Discharge Instructions (Signed)
You are having an allergic reaction to the armpits due to the deodorant you used. Use mild, hypoallergenic soap to cleanse the area. Pat the skin dry, and apply the ointment twice daily. Avoid shaving the area while the skin is inflamed.

## 2017-09-30 NOTE — ED Provider Notes (Signed)
[facility] Emergency Department Provider Note ____________________________________________  Time seen: 1417  I have reviewed the triage vital signs and the nursing notes.  HISTORY  Chief Complaint  Rash  HPI Laura Shaffer is a 27 y.o. female presents to the ED for evaluation of itchy rash to the bilateral armpits. She admits to using an aluminium-free, all-natural deodorant prior to onset of symptoms. She notes peeling, itchy, darkened skin to the armpits. She admits the itchiness of the armpits, makes her seem to itch all over. She denies any fevers, chills, sweats, or weeping wounds. She has since discontinued the deodorant and avoid shaving.   She has also admitted to being in transition from living with her father her in [place]. She is currently sleeping in her car, although she is working part-time. She has utilized some community resources between here and [place].   Past Medical History:  Diagnosis Date  . Asthma     Patient Active Problem List   Diagnosis Date Noted  . Vaginal odor 10/10/2013    History reviewed. No pertinent surgical history.  Prior to Admission medications   Medication Sig Start Date End Date Taking? Authorizing Provider  dextromethorphan-guaiFENesin (MUCINEX DM) 30-600 MG 12hr tablet Take 1 tablet by mouth 2 (two) times daily. 09/13/16   Gunnar BullaLawhorn, Jenkins Michelle, CNM  drospirenone-ethinyl estradiol (YAZ) 3-0.02 MG tablet Take 1 tablet by mouth daily. 09/13/16   Gunnar BullaLawhorn, Jenkins Michelle, CNM  ibuprofen (ADVIL,MOTRIN) 600 MG tablet Take 1 tablet (600 mg total) by mouth every 6 (six) hours as needed for mild pain or moderate pain. 09/20/14   Trixie DredgeWest, Emily, PA-C  oxymetazoline (AFRIN NASAL SPRAY) 0.05 % nasal spray Place 1 spray into both nostrils 2 (two) times daily as needed for congestion. X 3 days only 09/20/14   Trixie DredgeWest, Emily, PA-C  PRESCRIPTION MEDICATION Apply 1 application topically daily as needed (acne). Applied to face     [provider]  triamcinolone (KENALOG) 0.025 % ointment Apply 1 application topically 2 (two) times daily. 09/29/17   Jleigh Striplin, Charlesetta IvoryJenise V Bacon, PA-C    Allergies Patient has no known allergies.  Family History  Problem Relation Age of Onset  . Diabetes Maternal Uncle   . Hypertension Maternal Uncle   . Hypertension Maternal Aunt   . Prostate cancer Maternal Uncle   . Sickle cell trait Other     Social History Social History   Tobacco Use  . Smoking status: Never Smoker  . Smokeless tobacco: Never Used  Substance Use Topics  . Alcohol use: Yes    Comment: occasionally   . Drug use: Yes    Types: Marijuana    Comment: occasionally, not like she used too    Review of Systems  Constitutional: Negative for fever. Eyes: Negative for visual changes. ENT: Negative for sore throat. Cardiovascular: Negative for chest pain. Respiratory: Negative for shortness of breath. Gastrointestinal: Negative for abdominal pain, vomiting and diarrhea. Genitourinary: Negative for dysuria. Musculoskeletal: Negative for back pain. Skin: Positive for rash. Neurological: Negative for headaches, focal weakness or numbness. ____________________________________________  PHYSICAL EXAM:  VITAL SIGNS: ED Triage Vitals [09/29/17 1308]  Enc Vitals Group     BP 117/70     Pulse Rate 66     Resp 18     Temp 98.2 F (36.8 C)     Temp Source Oral     SpO2 99 %     Weight 110 lb (49.9 kg)     Height 5\' 4"  (1.626 m)  Head Circumference      Peak Flow      Pain Score 0     Pain Loc      Pain Edu?      Excl. in GC?     Constitutional: Alert and oriented. Well appearing and in no distress. Head: Normocephalic and atraumatic. Eyes: Conjunctivae are normal. Normal extraocular movements Cardiovascular: Normal rate, regular rhythm. Normal distal pulses. Respiratory: Normal respiratory effort. No wheezes/rales/rhonchi. Gastrointestinal: Soft and nontender. No  distention. Musculoskeletal: Nontender with normal range of motion in all extremities.  Neurologic:  Normal gait without ataxia. Normal speech and language. No gross focal neurologic deficits are appreciated. Skin:  Skin is warm, dry and intact. Patient with hypertrophic, dry, peeling skin noted to the bilateral axilla. Some mild erythema is also appreciated.  Psychiatric: Mood and affect are normal. Patient exhibits appropriate insight and judgment. ____________________________________________  PROCEDURES  Procedures ____________________________________________  INITIAL IMPRESSION / ASSESSMENT AND PLAN / ED COURSE  Patient with an allergic contact dermatitis to the bilateral axilla. She is discharged with a prescription for triamcinolone 0.025% ointment. She will consider using baking soda or a spray antiperspirant as needed.   A social work consult is also ordered. The patient is provided with community resource information by me and the RN prior to discharge. She is encouraged to return to the ED as needed.   ____________________________________________  FINAL CLINICAL IMPRESSION(S) / ED DIAGNOSES  Final diagnoses:  Irritant contact dermatitis due to cosmetics      Piercen Covino, Charlesetta Ivory, PA-C 10/01/17 0001    Loleta Rose, MD 10/01/17 [phone]

## 2017-12-21 ENCOUNTER — Other Ambulatory Visit: Payer: Self-pay

## 2017-12-21 ENCOUNTER — Ambulatory Visit: Payer: Medicaid Other | Admitting: Gerontology

## 2017-12-21 ENCOUNTER — Encounter: Payer: Self-pay | Admitting: Gerontology

## 2017-12-21 DIAGNOSIS — Z0000 Encounter for general adult medical examination without abnormal findings: Secondary | ICD-10-CM

## 2017-12-21 DIAGNOSIS — K029 Dental caries, unspecified: Secondary | ICD-10-CM

## 2017-12-21 NOTE — Progress Notes (Signed)
  Patient: Laura Shaffer Female    DOB: 08/12/1990   27 y.o.   MRN: [id number] Visit Date: 12/21/2017  Today's Provider: Langston Reusing, NP   Chief Complaint  Patient presents with  . New Patient (Initial Visit)    establish care, would like to see the dentist   Subjective:    HPI Laura Shaffer 27 y/o female presents for initial clinic visit and to evaluate her dental concerns. She reports that she has cavities in some of her teeth, and has not being to the dentist for many years and requests dental referral. She denies any tooth ache, or abscess. She denies chest pain, palpitation, shortness of breath, fever, chills, and otherwise she reports being in good health.    No Known Allergies Previous Medications   No medications on file    Review of Systems  HENT: Positive for dental problem (cavities).   Eyes: Negative.   Respiratory: Negative.   Cardiovascular: Negative.   Gastrointestinal: Negative.   Genitourinary: Negative.   Musculoskeletal: Negative.   Skin: Negative.   Neurological: Negative.   Psychiatric/Behavioral: Negative.     Social History   Tobacco Use  . Smoking status: Never Smoker  . Smokeless tobacco: Never Used  Substance Use Topics  . Alcohol use: Yes    Comment: occasionally    Objective:   BP 122/76 (BP Location: Left Arm, Patient Position: Sitting)   Pulse 73   Temp 98.1 F (36.7 C)   Ht '5\' 4"'$  (1.626 m)   Wt 115 lb (52.2 kg)   SpO2 98%   BMI 19.74 kg/m   Physical Exam  Constitutional: She is oriented to person, place, and time. She appears well-developed.  HENT:  Head: Normocephalic.  Eyes: Pupils are equal, round, and reactive to light. EOM are normal.  Neck: Normal range of motion.  Cardiovascular: Normal rate and regular rhythm.  Pulmonary/Chest: Effort normal and breath sounds normal.  Abdominal: Soft. Bowel sounds are normal.  Musculoskeletal: Normal range of motion.  Neurological: She is alert and oriented to person, place,  and time.  Skin: Skin is warm and dry.  Psychiatric: She has a normal mood and affect. Her behavior is normal. Judgment and thought content normal.        Assessment & Plan:     1. Health care maintenance  - CBC w/Diff; Future - Comp Met (CMET); Future - Urinalysis; Future - Follow up in 6 months  2. Dental cavities - Dental referral       [place], NP   [facility]

## 2017-12-27 ENCOUNTER — Other Ambulatory Visit: Payer: Medicaid Other

## 2017-12-27 DIAGNOSIS — Z0000 Encounter for general adult medical examination without abnormal findings: Secondary | ICD-10-CM

## 2017-12-28 LAB — COMPREHENSIVE METABOLIC PANEL
ALK PHOS: 63 IU/L (ref 39–117)
ALT: 9 IU/L (ref 0–32)
AST: 20 IU/L (ref 0–40)
Albumin/Globulin Ratio: 2 (ref 1.2–2.2)
Albumin: 4.5 g/dL (ref 3.5–5.5)
BUN/Creatinine Ratio: 12 (ref 9–23)
BUN: 10 mg/dL (ref 6–20)
Bilirubin Total: <0.2 mg/dL (ref 0.0–1.2)
CO2: 24 mmol/L (ref 20–29)
Calcium: 9.8 mg/dL (ref 8.7–10.2)
Chloride: 100 mmol/L (ref 96–106)
Creatinine, Ser: 0.82 mg/dL (ref 0.57–1.00)
GFR calc non Af Amer: 99 mL/min/{1.73_m2} (ref >59)
GFR, EST AFRICAN AMERICAN: 114 mL/min/{1.73_m2} (ref >59)
Globulin, Total: 2.3 g/dL (ref 1.5–4.5)
Glucose: 72 mg/dL (ref 65–99)
POTASSIUM: 4.7 mmol/L (ref 3.5–5.2)
Sodium: 138 mmol/L (ref 134–144)
Total Protein: 6.8 g/dL (ref 6.0–8.5)

## 2017-12-28 LAB — CBC WITH DIFFERENTIAL/PLATELET
BASOS ABS: 0.1 10*3/uL (ref 0.0–0.2)
Basos: 1 %
EOS (ABSOLUTE): 0.5 10*3/uL — AB (ref 0.0–0.4)
Eos: 6 %
HEMATOCRIT: 41.9 % (ref 34.0–46.6)
HEMOGLOBIN: 13.6 g/dL (ref 11.1–15.9)
IMMATURE GRANULOCYTES: 0 %
Immature Grans (Abs): 0 10*3/uL (ref 0.0–0.1)
LYMPHS ABS: 2.7 10*3/uL (ref 0.7–3.1)
LYMPHS: 31 %
MCH: 28.6 pg (ref 26.6–33.0)
MCHC: 32.5 g/dL (ref 31.5–35.7)
MCV: 88 fL (ref 79–97)
MONOCYTES: 7 %
Monocytes Absolute: 0.6 10*3/uL (ref 0.1–0.9)
NEUTROS ABS: 5 10*3/uL (ref 1.4–7.0)
Neutrophils: 55 %
PLATELETS: 239 10*3/uL (ref 150–450)
RBC: 4.76 x10E6/uL (ref 3.77–5.28)
RDW: 11.9 % — ABNORMAL LOW (ref 12.3–15.4)
WBC: 9 10*3/uL (ref 3.4–10.8)

## 2017-12-28 LAB — URINALYSIS
BILIRUBIN UA: NEGATIVE
GLUCOSE, UA: NEGATIVE
KETONES UA: NEGATIVE
LEUKOCYTES UA: NEGATIVE
NITRITE UA: NEGATIVE
PH UA: 5 (ref 5.0–7.5)
Protein, UA: NEGATIVE
RBC UA: NEGATIVE
SPEC GRAV UA: 1.028 (ref 1.005–1.030)
UUROB: 0.2 mg/dL (ref 0.2–1.0)

## 2018-06-21 ENCOUNTER — Ambulatory Visit: Payer: Medicaid Other | Admitting: Gerontology

## 2018-08-31 ENCOUNTER — Ambulatory Visit (INDEPENDENT_AMBULATORY_CARE_PROVIDER_SITE_OTHER): Payer: Self-pay | Admitting: Psychiatry

## 2018-08-31 ENCOUNTER — Other Ambulatory Visit: Payer: Self-pay

## 2018-08-31 DIAGNOSIS — F4322 Adjustment disorder with anxiety: Secondary | ICD-10-CM

## 2018-08-31 NOTE — Progress Notes (Signed)
PROBLEM-FOCUSED INITIAL PSYCHOTHERAPY EVALUATION Luan Moore, PhD LP [facility]  Name: Laura Shaffer Date: 08/31/2018 Time spent: 47 min MRN: [id number] DOB: Aug 16, 1990 Guardian/Payee: self  PCP: System, Pcp Not In Documentation requested on this visit: No  PROBLEM HISTORY Reason for Visit /Presenting Problem:  Chief Complaint  Patient presents with  . Establish Care  . Depression    Narrative/History of Present Illness Referred by self, after meeting [place] in a job-related evaluation, for treatment of "past pain" and current stresses.  Has both on remembering hurtful things from the past, sometimes unsignalled.  Got out of a toxic friendship with an older woman she met by chance at [place] rest stop on the highway, local woman who came to be dependent and intrusive, a hostile-dependent, guilt-mongering relationship.  Went through a lot of strain trying to help her and be godly about it and kept coming back when she apologized.  All verbal, the woman repeatedly became judgmental of things PT kept trying to "make peace", had to block her, eventually change her phone number to be unreachable.  The stress of it had her avoiding the rest area they met. Some insomnia, had plenty of guilt until realized she had the right to take a stand.  Living situation difficult.  Been homeless, living in her car, off & on living with father in 1-BR apartment, but strained relationship.  Uninsured.  Has been in touch with [employer] before.  On SNAP.  Aware of [facility] as a resource.  Grew up in [place].  Mother died 03/19/02 ([birth year], 27 yrs old).    Prior Psychiatric Assessment/Treatment:   Outpatient treatment: none stated Psychiatric hospitalization: none stated Psychological assessment/testing: none stated   Abuse History: Victim of abuse: suspected.   Victim of neglect: Not assessed at this time / none suspected.   Perpetrator of abuse/neglect: No.   Witness / Exposure to Domestic Violence: Not  assessed at this time / none suspected.   Witness to Community Violence:  Not assessed at this time / none suspected.   Protective Services Involvement: No.   Report needed: No.    Substance Abuse History: Current substance abuse: Not assessed at this time / none suspected.   History of impactful substance use/abuse: Not assessed at this time / none suspected.     FAMILY/SOCIAL HISTORY Family of origin -- deferred Family of intention/current living situation -- alone, in car Education -- Highest level of education is Not assessed Vocation -- Currently working with [employer], since June 2020. Finances -- Current income from employment, with significant concerns for affording all basic expenses  Spiritually -- Spiritual/religious identification Protestant.  Practicing: Yes Enjoyable activities -- Not assessed.  Other situational factors affecting treatment and prognosis: Stressors from the following areas: Financial difficulties Other: social isolation Barriers to service: finances  Notable cultural sensitivities: none stated Strengths: Spirituality, Hopefulness and Self Advocate   MED/SURG HISTORY Med/surg history was reviewed with PT at this time.   Past Medical History:  Diagnosis Date  . Asthma    May wheeze sometimes, but under control without medication.    No past surgical history on file.  No Known Allergies  Medications (as listed in Epic): No current outpatient medications on file.   No current facility-administered medications for this visit.     MENTAL STATUS AND OBSERVATIONS Appearance:   Casual and slight build     Behavior:  Appropriate  Motor:  Normal  Speech/Language:   impressionistic  Affect:  Appropriate  Mood:  euthymic and alludes to anxiety  Thought process:  normal  Thought content:    WNL  Sensory/Perceptual disturbances:    none stated  Orientation:  grossly intact  Attention:  Good  Concentration:  Good  Memory:  grossly intact   Fund of knowledge:   Fair  Insight:    Fair  Judgment:   Good, with situational concern  Impulse Control:  Good  Initial Risk Assessment: Danger to Self: No Self-injurious Behavior: No Danger to Others: No Physical Aggression / Violence: No Duty to Warn: No Access to Firearms a concern: No Gang Involvement: No Patient / guardian was educated about steps to take if suicide or homicide risk level increases between visits: yes . While future psychiatric events cannot be accurately predicted, the patient does not currently require acute inpatient psychiatric care and does not currently meet [facility] involuntary commitment criteria.   DIAGNOSIS:    ICD-10-CM   1. Adjustment disorder with anxious mood  F43.22     INITIAL TREATMENT: . Ethical orientation and verbal consents to o privacy rights including, but not limited to, HIPAA provisions and any questions, EMR and use of e-PHI o patient responsibilities, including scheduling and fair notice of changes, method of visit options and regulatory and financial conditions affecting them o expectations for working relationship in psychotherapy o expectations and consents for working partnerships with other health care disciplines, especially including medication and other behavioral health providers . Support/validation for distressing symptoms . Confirmed rapport . Affirmed boundary-setting with the apparently attachment-disordered woman described . Probed PT's financial stability and clear difficulty bearing the cost of treatment on an out-of-pocket basis.  As PT is listed with Medicaid, strongly recommend referral to a Medicaid provider. . Discussed shelter and social support options to mitigate homelessness  Plan: . Self-affirm OK to set limits with friend . Option to [employer] . Consult Education officer, museum with DSS for any further resource needs and questions . Refer to [facility] for medical care, ask about [employer]  . See [facility] for peer mentoring, if desired . [facility] [place] for low-cost counseling; Development worker, international aid, [facility], or [place] for [place] covered services . Call the clinic on-call service, present to ER, or call 911 if any life-threatening psychiatric crisis No follow-ups on file.  Blanchie Serve, PhD  Luan Moore, PhD LP Clinical Psychologist, [employer] [facility] [address] [place], [place] [address] [phone]

## 2018-11-08 ENCOUNTER — Other Ambulatory Visit: Payer: Self-pay

## 2018-11-08 ENCOUNTER — Emergency Department (HOSPITAL_COMMUNITY)
Admission: EM | Admit: 2018-11-08 | Discharge: 2018-11-09 | Disposition: A | Discharge status: Home / Self Care | Payer: Medicaid Other | Attending: Emergency Medicine | Admitting: Emergency Medicine

## 2018-11-08 DIAGNOSIS — R21 Rash and other nonspecific skin eruption: Secondary | ICD-10-CM | POA: Insufficient documentation

## 2018-11-08 DIAGNOSIS — J45909 Unspecified asthma, uncomplicated: Secondary | ICD-10-CM | POA: Insufficient documentation

## 2018-11-08 DIAGNOSIS — R42 Dizziness and giddiness: Secondary | ICD-10-CM | POA: Insufficient documentation

## 2018-11-08 DIAGNOSIS — R5383 Other fatigue: Secondary | ICD-10-CM

## 2018-11-08 LAB — CBC WITH DIFFERENTIAL/PLATELET
Abs Immature Granulocytes: 0.01 10*3/uL (ref 0.00–0.07)
Basophils Absolute: 0.1 10*3/uL (ref 0.0–0.1)
Basophils Relative: 1 %
Eosinophils Absolute: 0.6 10*3/uL — ABNORMAL HIGH (ref 0.0–0.5)
Eosinophils Relative: 8 %
HCT: 39.9 % (ref 36.0–46.0)
Hemoglobin: 13.1 g/dL (ref 12.0–15.0)
Immature Granulocytes: 0 %
Lymphocytes Relative: 31 %
Lymphs Abs: 2.4 10*3/uL (ref 0.7–4.0)
MCH: 30 pg (ref 26.0–34.0)
MCHC: 32.8 g/dL (ref 30.0–36.0)
MCV: 91.5 fL (ref 80.0–100.0)
Monocytes Absolute: 0.6 10*3/uL (ref 0.1–1.0)
Monocytes Relative: 8 %
Neutro Abs: 4.1 10*3/uL (ref 1.7–7.7)
Neutrophils Relative %: 52 %
Platelets: 180 10*3/uL (ref 150–400)
RBC: 4.36 MIL/uL (ref 3.87–5.11)
RDW: 12 % (ref 11.5–15.5)
WBC: 7.8 10*3/uL (ref 4.0–10.5)
nRBC: 0 % (ref 0.0–0.2)

## 2018-11-08 LAB — COMPREHENSIVE METABOLIC PANEL
ALT: 13 U/L (ref 0–44)
AST: 17 U/L (ref 15–41)
Albumin: 3.8 g/dL (ref 3.5–5.0)
Alkaline Phosphatase: 53 U/L (ref 38–126)
Anion gap: 9 (ref 5–15)
BUN: 7 mg/dL (ref 6–20)
CO2: 24 mmol/L (ref 22–32)
Calcium: 9.5 mg/dL (ref 8.9–10.3)
Chloride: 105 mmol/L (ref 98–111)
Creatinine, Ser: 0.86 mg/dL (ref 0.44–1.00)
GFR calc Af Amer: >60 mL/min (ref >60)
GFR calc non Af Amer: >60 mL/min (ref >60)
Glucose, Bld: 85 mg/dL (ref 70–99)
Potassium: 3.4 mmol/L — ABNORMAL LOW (ref 3.5–5.1)
Sodium: 138 mmol/L (ref 135–145)
Total Bilirubin: 0.6 mg/dL (ref 0.3–1.2)
Total Protein: 6.9 g/dL (ref 6.5–8.1)

## 2018-11-08 LAB — URINALYSIS, ROUTINE W REFLEX MICROSCOPIC
Bacteria, UA: NONE SEEN
Bilirubin Urine: NEGATIVE
Glucose, UA: NEGATIVE mg/dL
Hgb urine dipstick: NEGATIVE
Ketones, ur: 5 mg/dL — AB
Leukocytes,Ua: NEGATIVE
Nitrite: NEGATIVE
Protein, ur: 30 mg/dL — AB
Specific Gravity, Urine: 1.031 — ABNORMAL HIGH (ref 1.005–1.030)
pH: 5 (ref 5.0–8.0)

## 2018-11-08 LAB — I-STAT BETA HCG BLOOD, ED (MC, WL, AP ONLY): I-stat hCG, quantitative: <5 m[IU]/mL (ref <5)

## 2018-11-08 NOTE — ED Triage Notes (Addendum)
Pt states that "I just don't feel good on the inside", states she wakes up from sleep feeling weak. C/o dizziness and lightheadedness, her hair being dry, boils on her body,etc. Very concerned that something is "going on in the inside".

## 2018-11-09 LAB — HIV ANTIBODY (ROUTINE TESTING W REFLEX): HIV Screen 4th Generation wRfx: NONREACTIVE

## 2018-11-09 LAB — TSH: TSH: 2.279 u[IU]/mL (ref 0.350–4.500)

## 2018-11-09 NOTE — ED Notes (Signed)
PT called x3 for vitals recheck with no response.

## 2018-11-09 NOTE — Discharge Instructions (Addendum)
Follow-up with your doctor.  Thyroid and HIV been tested and need to be checked as an outpatient.

## 2018-11-09 NOTE — ED Provider Notes (Signed)
[facility] EMERGENCY DEPARTMENT Provider Note   CSN: [id number] Arrival date & time: 11/08/18  2158     History   Chief Complaint Chief Complaint  Patient presents with  . Medical Clearance    HPI Laura Shaffer is a 28 y.o. female.     HPI Patient presents with fatigue and feeling bad.  Has been going on for years.  Dizziness lightheaded at times.  States her hair is dry she will get rashes.  States she is been worked up previously for this and no causes been found.  States she is not really feeling worse now.  Wonders if it caused by stress.  States she may have some weight loss.  Decreased appetite.  No chest pain or abdominal pain.  Does feel dizzy at times.  Has a PCP at [facility] clinic. Past Medical History:  Diagnosis Date  . Asthma     Patient Active Problem List   Diagnosis Date Noted  . Health care maintenance 12/21/2017  . Dental cavities 12/21/2017  . Vaginal odor 10/10/2013    No past surgical history on file.   OB History    Gravida  1   Para      Term      Preterm      AB  1   Living  0     SAB  1   TAB      Ectopic      Multiple      Live Births               Home Medications    Prior to Admission medications   Not on File    Family History Family History  Problem Relation Age of Onset  . Diabetes Maternal Uncle   . Hypertension Maternal Uncle   . Hypertension Maternal Aunt   . Prostate cancer Maternal Uncle   . Sickle cell trait Other     Social History Social History   Tobacco Use  . Smoking status: Never Smoker  . Smokeless tobacco: Never Used  Substance Use Topics  . Alcohol use: Yes    Comment: occasionally   . Drug use: Yes    Types: Marijuana    Comment: occasionally, not like she used too     Allergies   Patient has no known allergies.   Review of Systems Review of Systems  Constitutional: Positive for appetite change, fatigue and unexpected weight change.  HENT:  Negative for congestion.   Respiratory: Negative for shortness of breath.   Gastrointestinal: Negative for abdominal pain.  Genitourinary: Negative for flank pain.  Musculoskeletal: Negative for back pain.  Skin: Positive for rash.  Hematological: Negative for adenopathy.     Physical Exam Updated Vital Signs BP 101/65 (BP Location: Left Arm)   Pulse 85   Temp 97.8 F (36.6 C) (Oral)   Resp 15   SpO2 98%   Physical Exam Vitals signs and nursing note reviewed.  Constitutional:      Appearance: Normal appearance.  HENT:     Mouth/Throat:     Mouth: Mucous membranes are moist.     Pharynx: No oropharyngeal exudate or posterior oropharyngeal erythema.  Eyes:     Pupils: Pupils are equal, round, and reactive to light.  Neck:     Musculoskeletal: Neck supple.     Comments: Small lymph node palpated on left neck. Cardiovascular:     Rate and Rhythm: Normal rate and regular rhythm.  Pulmonary:  Breath sounds: Normal breath sounds.  Abdominal:     Tenderness: There is no abdominal tenderness. There is no guarding.  Musculoskeletal:     Right lower leg: No edema.     Left lower leg: No edema.  Skin:    Capillary Refill: Capillary refill takes less than 2 seconds.     Findings: No rash.  Neurological:     Mental Status: Mental status is at baseline.      ED Treatments / Results  Labs (all labs ordered are listed, but only abnormal results are displayed) Labs Reviewed  COMPREHENSIVE METABOLIC PANEL - Abnormal; Notable for the following components:      Result Value   Potassium 3.4 (*)    All other components within normal limits  CBC WITH DIFFERENTIAL/PLATELET - Abnormal; Notable for the following components:   Eosinophils Absolute 0.6 (*)    All other components within normal limits  URINALYSIS, ROUTINE W REFLEX MICROSCOPIC - Abnormal; Notable for the following components:   Color, Urine AMBER (*)    APPearance HAZY (*)    Specific Gravity, Urine 1.031 (*)     Ketones, ur 5 (*)    Protein, ur 30 (*)    All other components within normal limits  TSH  HIV ANTIBODY (ROUTINE TESTING W REFLEX)  I-STAT BETA HCG BLOOD, ED ([facility], [facility], [facility] ONLY)    EKG EKG Interpretation  Date/Time:  Thursday November 08 2018 22:10:50 EDT Ventricular Rate:  108 PR Interval:  138 QRS Duration: 72 QT Interval:  346 QTC Calculation: 463 R Axis:   77 Text Interpretation:  Sinus tachycardia Nonspecific T wave abnormality Abnormal ECG Confirmed by Benjiman Core [phone]) on 11/09/2018 8:40:01 AM   Radiology No results found.  Procedures Procedures (including critical care time)  Medications Ordered in ED Medications - No data to display   Initial Impression / Assessment and Plan / ED Course  I have reviewed the triage vital signs and the nursing notes.  Pertinent labs & imaging results that were available during my care of the patient were reviewed by me and considered in my medical decision making (see chart for details).        Patient with generalized weakness.  Mild hypokalemia.  Likely can supplement with just improving her diet.  States she is vegetarian.  States she felt better when she was not eating meat.  Has been having these symptoms for years.  We will add a TSH and HIV.  Outpatient follow-up.  Final Clinical Impressions(s) / ED Diagnoses   Final diagnoses:  Fatigue, unspecified type    ED Discharge Orders    None       Benjiman Core, MD 11/09/18 [phone]

## 2019-08-27 ENCOUNTER — Other Ambulatory Visit: Payer: Self-pay

## 2019-10-17 ENCOUNTER — Encounter: Payer: Self-pay | Admitting: Emergency Medicine

## 2019-10-17 ENCOUNTER — Other Ambulatory Visit: Payer: Self-pay

## 2019-10-17 DIAGNOSIS — F419 Anxiety disorder, unspecified: Secondary | ICD-10-CM | POA: Insufficient documentation

## 2019-10-17 DIAGNOSIS — R112 Nausea with vomiting, unspecified: Secondary | ICD-10-CM | POA: Insufficient documentation

## 2019-10-17 DIAGNOSIS — J45909 Unspecified asthma, uncomplicated: Secondary | ICD-10-CM | POA: Insufficient documentation

## 2019-10-17 DIAGNOSIS — R21 Rash and other nonspecific skin eruption: Secondary | ICD-10-CM | POA: Insufficient documentation

## 2019-10-17 DIAGNOSIS — Z20822 Contact with and (suspected) exposure to covid-19: Secondary | ICD-10-CM | POA: Insufficient documentation

## 2019-10-17 DIAGNOSIS — R109 Unspecified abdominal pain: Secondary | ICD-10-CM | POA: Insufficient documentation

## 2019-10-17 LAB — CBC
HCT: 38.5 % (ref 36.0–46.0)
Hemoglobin: 13 g/dL (ref 12.0–15.0)
MCH: 29.5 pg (ref 26.0–34.0)
MCHC: 33.8 g/dL (ref 30.0–36.0)
MCV: 87.3 fL (ref 80.0–100.0)
Platelets: 175 10*3/uL (ref 150–400)
RBC: 4.41 MIL/uL (ref 3.87–5.11)
RDW: 12.8 % (ref 11.5–15.5)
WBC: 8.4 10*3/uL (ref 4.0–10.5)
nRBC: 0 % (ref 0.0–0.2)

## 2019-10-17 LAB — URINALYSIS, COMPLETE (UACMP) WITH MICROSCOPIC
Bilirubin Urine: NEGATIVE
Glucose, UA: NEGATIVE mg/dL
Hgb urine dipstick: NEGATIVE
Ketones, ur: NEGATIVE mg/dL
Leukocytes,Ua: NEGATIVE
Nitrite: NEGATIVE
Protein, ur: NEGATIVE mg/dL
Specific Gravity, Urine: 1.018 (ref 1.005–1.030)
pH: 7 (ref 5.0–8.0)

## 2019-10-17 LAB — COMPREHENSIVE METABOLIC PANEL
ALT: 12 U/L (ref 0–44)
AST: 14 U/L — ABNORMAL LOW (ref 15–41)
Albumin: 4 g/dL (ref 3.5–5.0)
Alkaline Phosphatase: 39 U/L (ref 38–126)
Anion gap: 9 (ref 5–15)
BUN: 12 mg/dL (ref 6–20)
CO2: 24 mmol/L (ref 22–32)
Calcium: 9.8 mg/dL (ref 8.9–10.3)
Chloride: 104 mmol/L (ref 98–111)
Creatinine, Ser: 0.98 mg/dL (ref 0.44–1.00)
GFR calc Af Amer: >60 mL/min (ref >60)
GFR calc non Af Amer: >60 mL/min (ref >60)
Glucose, Bld: 89 mg/dL (ref 70–99)
Potassium: 3.7 mmol/L (ref 3.5–5.1)
Sodium: 137 mmol/L (ref 135–145)
Total Bilirubin: 0.4 mg/dL (ref 0.3–1.2)
Total Protein: 7 g/dL (ref 6.5–8.1)

## 2019-10-17 LAB — POCT PREGNANCY, URINE: Preg Test, Ur: NEGATIVE

## 2019-10-17 LAB — LIPASE, BLOOD: Lipase: 29 U/L (ref 11–51)

## 2019-10-17 NOTE — ED Triage Notes (Signed)
Pt presents to ED with c/o generalized abd pain that is described as an ache and is tender with palpation. Pt reports vomiting X4 this evening. Onset of symptoms around 1700.

## 2019-10-18 ENCOUNTER — Emergency Department
Admission: EM | Admit: 2019-10-18 | Discharge: 2019-10-18 | Disposition: A | Discharge status: Home / Self Care | Payer: Medicaid Other | Attending: Emergency Medicine | Admitting: Emergency Medicine

## 2019-10-18 ENCOUNTER — Other Ambulatory Visit: Payer: Self-pay

## 2019-10-18 DIAGNOSIS — R112 Nausea with vomiting, unspecified: Secondary | ICD-10-CM

## 2019-10-18 LAB — RESPIRATORY PANEL BY RT PCR (FLU A&B, COVID)
Influenza A by PCR: NEGATIVE
Influenza B by PCR: NEGATIVE
SARS Coronavirus 2 by RT PCR: NEGATIVE

## 2019-10-18 MED ORDER — HYDROXYZINE HCL 25 MG PO TABS: 25.0000 mg | ORAL_TABLET | Freq: Three times a day (TID) | ORAL | 0 refills | Status: DC | PRN

## 2019-10-18 MED ORDER — ONDANSETRON HCL 4 MG PO TABS: 4.0000 mg | ORAL_TABLET | Freq: Every day | ORAL | 0 refills | Status: DC | PRN

## 2019-10-18 NOTE — ED Provider Notes (Signed)
[facility] Emergency Department Provider Note  Time seen: 12:53 PM  I have reviewed the triage vital signs and the nursing notes.   HISTORY  Chief Complaint Emesis and Abdominal Pain   HPI Laura Shaffer is a 29 y.o. female with a past medical history of asthma presents to the emergency department for nausea vomiting and abdominal discomfort.  According to the patient for the past few weeks she has been experiencing intermittent abdominal discomfort, yesterday she had nausea with 2-3 episodes of vomiting.  Patient states she wanted to come get evaluated.  Patient does states she had unprotected sex this past Monday and was concerned she could be pregnant.  Patient states abdominal discomfort and cramping has been a long-term/chronic issue for her she also states she has noticed a rash around her face and scalp, but relates this to itching due to anxiety which she suffers from.  Patient denies any fever but does states she has had a slight cough.  Has not been vaccinated against Covid.   Past Medical History:  Diagnosis Date  . Asthma     Patient Active Problem List   Diagnosis Date Noted  . Health care maintenance 12/21/2017  . Dental cavities 12/21/2017  . Vaginal odor 10/10/2013    History reviewed. No pertinent surgical history.  Prior to Admission medications   Not on File    No Known Allergies  Family History  Problem Relation Age of Onset  . Diabetes Maternal Uncle   . Hypertension Maternal Uncle   . Hypertension Maternal Aunt   . Prostate cancer Maternal Uncle   . Sickle cell trait Other     Social History Social History   Tobacco Use  . Smoking status: Never Smoker  . Smokeless tobacco: Never Used  Vaping Use  . Vaping Use: Never used  Substance Use Topics  . Alcohol use: Yes    Comment: occasionally   . Drug use: Yes    Types: Marijuana    Comment: occasionally, not like she used too    Review of Systems Constitutional:  Negative for fever. Cardiovascular: Negative for chest pain. Respiratory: Negative for shortness of breath. Gastrointestinal: Mild abdominal cramping, chronic per patient.  Nausea vomiting yesterday but none since. Genitourinary: No urinary vaginal symptoms. Musculoskeletal: Negative for musculoskeletal complaints Neurological: Negative for headache All other ROS negative  ____________________________________________   PHYSICAL EXAM:  VITAL SIGNS: ED Triage Vitals  Enc Vitals Group     BP 10/17/19 1956 117/67     Pulse Rate 10/17/19 1956 65     Resp 10/17/19 1956 18     Temp 10/17/19 1956 99 F (37.2 C)     Temp Source 10/17/19 1956 Oral     SpO2 10/17/19 1956 100 %     Weight 10/17/19 1956 115 lb (52.2 kg)     Height 10/17/19 2002 5\' 4"  (1.626 m)     Head Circumference --      Peak Flow --      Pain Score 10/17/19 2002 7     Pain Loc --      Pain Edu? --      Excl. in GC? --    Constitutional: Alert and oriented. Well appearing and in no distress. Eyes: Normal exam ENT      Head: Normocephalic and atraumatic.      Mouth/Throat: Mucous membranes are moist. Cardiovascular: Normal rate, regular rhythm.  Respiratory: Normal respiratory effort without tachypnea nor retractions. Breath sounds are clear  Gastrointestinal: Soft, slight tenderness in all quadrants per patient although no reaction to deep palpation.  No rebound or guarding.  No distention. Musculoskeletal: Nontender with normal range of motion in all extremities.  Neurologic:  Normal speech and language. No gross focal neurologic deficits Skin:  Skin is warm, dry and intact.  Psychiatric: Mood and affect are normal     INITIAL IMPRESSION / ASSESSMENT AND PLAN / ED COURSE  Pertinent labs & imaging results that were available during my care of the patient were reviewed by me and considered in my medical decision making (see chart for details).   Patient presents to the emergency department for nausea  vomiting yesterday abdominal cramping which is chronic per patient.  Patient's work-up is essentially negative, no significant rash identified on my evaluation.  Patient does state significant anxiety, I believe she would benefit from a medicine such as hydroxyzine with anxiety as well as itching.  Patient's lab work is reassuring including urinalysis with a negative urine pregnancy test.  I did discuss with the patient if she had unprotected sex Monday she should take another pregnancy test in 1 to 2 weeks as a precaution.  No urinary or vaginal symptoms.  Patient is concerned she has had a slight cough along with the nausea wishes to be tested for Covid.  We will perform a Covid swab prior to discharge.  I discussed return precautions as well as PCP follow-up.  Laura Shaffer was evaluated in Emergency Department on 10/18/2019 for the symptoms described in the history of present illness. She was evaluated in the context of the global COVID-19 pandemic, which necessitated consideration that the patient might be at risk for infection with the SARS-CoV-2 virus that causes COVID-19. Institutional protocols and algorithms that pertain to the evaluation of patients at risk for COVID-19 are in a state of rapid change based on information released by regulatory bodies including the CDC and federal and state organizations. These policies and algorithms were followed during the patient's care in the ED.  ____________________________________________   FINAL CLINICAL IMPRESSION(S) / ED DIAGNOSES  Nausea vomiting   Minna Antis, MD 10/18/19 1257

## 2019-11-26 ENCOUNTER — Ambulatory Visit (HOSPITAL_COMMUNITY)
Admission: EM | Admit: 2019-11-26 | Discharge: 2019-11-26 | Disposition: A | Discharge status: Home / Self Care | Payer: No Payment, Other | Attending: Psychiatry | Admitting: Psychiatry

## 2019-11-26 ENCOUNTER — Encounter (HOSPITAL_COMMUNITY): Payer: Self-pay | Admitting: Behavioral Health

## 2019-11-26 ENCOUNTER — Other Ambulatory Visit: Payer: Self-pay

## 2019-11-26 DIAGNOSIS — Z733 Stress, not elsewhere classified: Secondary | ICD-10-CM | POA: Diagnosis not present

## 2019-11-26 DIAGNOSIS — F331 Major depressive disorder, recurrent, moderate: Secondary | ICD-10-CM | POA: Insufficient documentation

## 2019-11-26 DIAGNOSIS — Z5901 Sheltered homelessness: Secondary | ICD-10-CM | POA: Diagnosis not present

## 2019-11-26 MED ORDER — HYDROXYZINE HCL 25 MG PO TABS: 25.0000 mg | ORAL_TABLET | Freq: Three times a day (TID) | ORAL | 0 refills | Status: DC | PRN

## 2019-11-26 MED ORDER — ESCITALOPRAM OXALATE 10 MG PO TABS: 10.0000 mg | ORAL_TABLET | Freq: Every day | ORAL | 0 refills | Status: DC

## 2019-11-26 MED ORDER — MIRTAZAPINE 15 MG PO TABS: 15.0000 mg | ORAL_TABLET | Freq: Every day | ORAL | 0 refills | Status: DC

## 2019-11-26 NOTE — Discharge Instructions (Signed)

## 2019-11-26 NOTE — ED Triage Notes (Addendum)
Patient presented to [facility] as a walk-in with complaint of depression and passive SI and positive for audible hallucinations. Patient states stressors are current living situation, her spirituality and Family. Patient states, " I just don't know what to do anymore, and I keep trying to get help, but I am stuck, I am alone even with family I am alone." When asked if patient feels safe enough to go home patient responds," I don't know at this point I just want to do what's best for me."  Patient was prescribed vistaril in October, but " hates the way it makes me feel."  Triage processed explained. TTS will follow.

## 2019-11-26 NOTE — ED Provider Notes (Signed)
[facility] Medical Screening Exam  Patient Name: Laura Shaffer MRN: [id number] Date of Evaluation: 11/26/19 Chief Complaint:   Diagnosis:  Final diagnoses:  MDD (major depressive disorder), recurrent episode, moderate (HCC)    History of Present illness: Laura Shaffer is a 29 y.o. female.  Patient presents to the [facility] voluntarily as a walk-in reporting that she has been feeling depressed and yesterday she can hit a breaking point.  She states that yesterday she had some suicidal ideations with no plan or intent.  She states that she just got really overwhelmed with all of the stressors in her life.  She states that she has been bouncing from uncle to uncle living with them but her family is supportive but at the same time still feels as though she has no support.  She states she is currently living in a hotel with her uncle and his family.  She reports that yesterday she was with the family and that she was questioned if she has AIDS.  She stated that the conversation got to the point that they requested to review her medical record to prove that she did not have AIDS.  Patient reports that she has been having difficulty with sleep, appetite, depression, and anxiety.  She reports that she was diagnosed in approximately 2012 with bipolar disorder by [facility].  However when patient prescribed her symptoms it appears this patient is major depressive disorder.  Discussed medications with the patient.  She states that she feels that getting the resources that she needs in getting started on medications as what she is needing at this moment.  She states she does not feel that she needs to be admitted to an inpatient hospital.  After discussing medications she has agreed and understands starting Remeron 15 mg p.o. nightly, Lexapro 10 mg p.o. daily, and Vistaril 25 mg p.o. 3 times daily as needed.  Patient is provided with information for open access at the [facility] for medication management  as well as therapy.  Patient is also provided with information on [place] and wellness for medical follow-up as she reports having a history of asthma as well as assistance with her medications.  The patient has denied having any suicidal or homicidal ideations currently and denies any hallucinations.  She reports that she is going to stay with her family at the hotel for now.  Patient was provided with written prescriptions of her medications and informed about going to [facility] health and wellness as well as Corporate treasurer.  Psychiatric Specialty Exam  Presentation  General Appearance:Appropriate for Environment;Casual  Eye Contact:Good  Speech:Clear and Coherent;Normal Rate  Speech Volume:Normal  Handedness:Right   Mood and Affect  Mood:Depressed  Affect:Appropriate;Congruent   Thought Process  Thought Processes:Coherent  Descriptions of Associations:Intact  Orientation:Full (Time, Place and Person)  Thought Content:WDL  Hallucinations:None  Ideas of Reference:None  Suicidal Thoughts:No  Homicidal Thoughts:No   Sensorium  Memory:Immediate Good;Recent Good;Remote Good  Judgment:Good  Insight:Good   Executive Functions  Concentration:Good  Attention Span:Good  Recall:Good  Fund of Knowledge:Good  Language:Good   Psychomotor Activity  Psychomotor Activity:Normal   Assets  Assets:Communication Skills;Desire for Improvement;Housing;Physical Health;Social Support;Transportation   Sleep  Sleep:Fair  Number of hours: No data recorded  Physical Exam: Physical Exam Vitals and nursing note reviewed.  Constitutional:      Appearance: She is well-developed.  HENT:     Head: Normocephalic.  Eyes:     Pupils: Pupils are equal, round, and reactive  to light.  Cardiovascular:     Rate and Rhythm: Normal rate.  Pulmonary:     Effort: Pulmonary effort is normal.  Musculoskeletal:        General: Normal range of motion.  Neurological:     Mental  Status: She is alert and oriented to person, place, and time.    Review of Systems  Constitutional: Negative.   HENT: Negative.   Eyes: Negative.   Respiratory: Negative.   Cardiovascular: Negative.   Gastrointestinal: Negative.   Genitourinary: Negative.   Musculoskeletal: Negative.   Skin: Negative.   Neurological: Negative.   Endo/Heme/Allergies: Negative.   Psychiatric/Behavioral: Positive for depression. The patient is nervous/anxious and has insomnia.    Blood pressure 115/67, pulse 92, temperature 97.9 F (36.6 C), temperature source Oral, resp. rate 18, height 5\' 4"  (1.626 m), weight 108 lb (49 kg), SpO2 100 %. Body mass index is 18.54 kg/m.  Musculoskeletal: Strength & Muscle Tone: within normal limits Gait & Station: normal Patient leans: N/A   [facility] MSE Discharge Disposition for Follow up and Recommendations: Based on my evaluation the patient does not appear to have an emergency medical condition and can be discharged with resources and follow up care in outpatient services for Medication Management and Individual Therapy   , FNP 11/26/2019, 1:41 PM
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
## 2019-11-26 NOTE — ED Notes (Signed)
LOCKER #[id number] 

## 2019-12-10 ENCOUNTER — Other Ambulatory Visit: Payer: Self-pay

## 2019-12-10 ENCOUNTER — Encounter (HOSPITAL_COMMUNITY): Payer: Self-pay | Admitting: Emergency Medicine

## 2019-12-10 ENCOUNTER — Emergency Department (HOSPITAL_COMMUNITY)
Admission: EM | Admit: 2019-12-10 | Discharge: 2019-12-10 | Disposition: A | Discharge status: Home / Self Care | Payer: Medicaid Other | Attending: Emergency Medicine | Admitting: Emergency Medicine

## 2019-12-10 DIAGNOSIS — J4520 Mild intermittent asthma, uncomplicated: Secondary | ICD-10-CM

## 2019-12-10 MED ORDER — AEROCHAMBER PLUS FLO-VU LARGE MISC
Status: AC
  Administered 2019-12-10: 1
  Filled 2019-12-10: qty 1

## 2019-12-10 MED ORDER — ALBUTEROL SULFATE HFA 108 (90 BASE) MCG/ACT IN AERS
1.0000 | INHALATION_SPRAY | Freq: Once | RESPIRATORY_TRACT | Status: AC
  Administered 2019-12-10: 1 via RESPIRATORY_TRACT
  Filled 2019-12-10: qty 6.7

## 2019-12-10 MED ORDER — ALBUTEROL SULFATE HFA 108 (90 BASE) MCG/ACT IN AERS: 1.0000 | INHALATION_SPRAY | Freq: Four times a day (QID) | RESPIRATORY_TRACT | 0 refills | Status: DC | PRN

## 2019-12-10 MED ORDER — FAMOTIDINE 20 MG PO TABS: 20.0000 mg | ORAL_TABLET | Freq: Two times a day (BID) | ORAL | 0 refills | Status: DC

## 2019-12-10 NOTE — ED Triage Notes (Signed)
Pt presents to ED with complaints of asthma exacerbation over the last couple of days. States she does not have inhaler and mask makes it worse. States she's feeling depressed lately as well. Currently denies SI/HI.

## 2019-12-10 NOTE — ED Provider Notes (Signed)
[facility] EMERGENCY DEPARTMENT Provider Note   CSN: [id number] Arrival date & time: 12/10/19  1832     History Chief Complaint  Patient presents with  . Asthma    Laura Shaffer is a 29 y.o. female.  HPI  Patient is a 29 year old female with a history of asthma who presents to the emergency department due to wheezing.  She states that her symptoms are intermittent.  Her most recent exacerbation started a few days ago.  Reports mild shortness of breath.  No fevers, rhinorrhea, URI symptoms.  No tobacco use but she smokes marijuana intermittently.  Patient also states she has been more stressed lately.  She has been sleeping at relatives homes and doesn't have stable housing. She feels very anxious because of this. Denies SI/HI. Reports mild indigestion that seems to be occurring after meals recently.      Past Medical History:  Diagnosis Date  . Asthma     Patient Active Problem List   Diagnosis Date Noted  . Health care maintenance 12/21/2017  . Dental cavities 12/21/2017  . Vaginal odor 10/10/2013    History reviewed. No pertinent surgical history.   OB History    Gravida  1   Para      Term      Preterm      AB  1   Living  0     SAB  1   TAB      Ectopic      Multiple      Live Births              Family History  Problem Relation Age of Onset  . Diabetes Maternal Uncle   . Hypertension Maternal Uncle   . Hypertension Maternal Aunt   . Prostate cancer Maternal Uncle   . Sickle cell trait Other     Social History   Tobacco Use  . Smoking status: Never Smoker  . Smokeless tobacco: Never Used  Vaping Use  . Vaping Use: Never used  Substance Use Topics  . Alcohol use: Yes    Comment: occasionally   . Drug use: Yes    Types: Marijuana    Comment: occasionally, not like she used too    Home Medications Prior to Admission medications   Medication Sig Start Date End Date Taking? Authorizing Provider    escitalopram (LEXAPRO) 10 MG tablet Take 1 tablet (10 mg total) by mouth daily. 11/26/19 12/26/19  Money, Gerlene Burdock, FNP  hydrOXYzine (ATARAX/VISTARIL) 25 MG tablet Take 1 tablet (25 mg total) by mouth 3 (three) times daily as needed for anxiety. 11/26/19   Money, Gerlene Burdock, FNP  mirtazapine (REMERON) 15 MG tablet Take 1 tablet (15 mg total) by mouth at bedtime. 11/26/19 12/26/19  Money, Gerlene Burdock, FNP    Allergies    Patient has no known allergies.  Review of Systems   Review of Systems  Constitutional: Negative for chills and fever.  HENT: Negative for congestion, rhinorrhea and sore throat.   Respiratory: Positive for shortness of breath and wheezing. Negative for cough.     Physical Exam Updated Vital Signs BP (!) 148/71   Pulse (!) 104   Temp 97.9 F (36.6 C) (Oral)   Resp 18   Ht 5\' 4"  (1.626 m)   Wt 49 kg   SpO2 100%   BMI 18.54 kg/m   Physical Exam Vitals and nursing note reviewed.  Constitutional:      General: She is  not in acute distress.    Appearance: Normal appearance. She is not ill-appearing, toxic-appearing or diaphoretic.  HENT:     Head: Normocephalic and atraumatic.     Right Ear: External ear normal.     Left Ear: External ear normal.     Nose: Nose normal.     Mouth/Throat:     Mouth: Mucous membranes are moist.     Pharynx: Oropharynx is clear. No oropharyngeal exudate or posterior oropharyngeal erythema.  Eyes:     General: No scleral icterus.       Right eye: No discharge.        Left eye: No discharge.     Extraocular Movements: Extraocular movements intact.     Conjunctiva/sclera: Conjunctivae normal.  Cardiovascular:     Rate and Rhythm: Normal rate and regular rhythm.     Pulses: Normal pulses.     Heart sounds: Normal heart sounds. No murmur heard.  No friction rub. No gallop.      Comments: RRR without M/R/G. Pulmonary:     Effort: Pulmonary effort is normal. No respiratory distress.     Breath sounds: Normal breath sounds. No stridor.  No wheezing, rhonchi or rales.     Comments: LCTAB. No wheezing. O2 saturation at 100% on RA. Abdominal:     General: Abdomen is flat.     Tenderness: There is no abdominal tenderness.  Musculoskeletal:        General: Normal range of motion.     Cervical back: Normal range of motion and neck supple. No tenderness.  Skin:    General: Skin is warm and dry.  Neurological:     General: No focal deficit present.     Mental Status: She is alert and oriented to person, place, and time.  Psychiatric:        Mood and Affect: Mood normal.        Behavior: Behavior normal.    ED Results / Procedures / Treatments   Labs (all labs ordered are listed, but only abnormal results are displayed) Labs Reviewed - No data to display  EKG None  Radiology No results found.  Procedures Procedures   Medications Ordered in ED Medications  albuterol (VENTOLIN HFA) 108 (90 Base) MCG/ACT inhaler 1 puff (has no administration in time range)   ED Course  I have reviewed the triage vital signs and the nursing notes.  Pertinent labs & imaging results that were available during my care of the patient were reviewed by me and considered in my medical decision making (see chart for details).    MDM Rules/Calculators/A&P                          Patient is a 29 year old female who presents with multiple complaints.  She notes a history of asthma and has not had an albuterol inhaler to use recently.  She has been experiencing intermittent wheezing.  No current wheezing on my exam.  Lungs are clear to auscultation bilaterally.  O2 saturations at 100% on room air.  Will give patient albuterol in the emergency department that she can also take home to continue using.  Will give a prescription for albuterol.  Patient also complains of mild acid reflux after meals.  She states she is extremely stressed recently and thinks this might be a factor as well.  We will discharge on a short course of  Pepcid.  Patient also states that she feels quite anxious lately.  She denies SI/HI but is stressed because she does not have stable housing and has been living with relatives in the area.  Patient requests a referral for a PCP.  She is not insured.  We will give a referral to [facility] health and wellness.  Recommended she reach out to them tomorrow to schedule a follow-up appointment.  Return to the ED with new or worsening symptoms.  Her questions were answered and she was amicable at the time of discharge.  Her vital signs are stable.  Final Clinical Impression(s) / ED Diagnoses Final diagnoses:  Mild intermittent asthma without complication   Rx / DC Orders ED Discharge Orders         Ordered    albuterol (VENTOLIN HFA) 108 (90 Base) MCG/ACT inhaler  Every 6 hours PRN        12/10/19 2155    famotidine (PEPCID) 20 MG tablet  2 times daily        12/10/19 2155           Placido Sou, PA-C 12/10/19 2203    Geoffery Lyons, MD 12/10/19 2216

## 2019-12-10 NOTE — Discharge Instructions (Signed)
I prescribed you a medication called Pepcid.  You can take this 1-2 times per day for acid reflux.  Try to take this when you are eating your meals.  I also prescribed you another albuterol inhaler if you find your wheezing worsens again.  If you develop new or worsening symptoms please return to the ER.  Have given a referral to [facility] community health and wellness.  Please give them a call tomorrow to schedule a follow-up appointment.  It was a pleasure to meet you.

## 2019-12-11 ENCOUNTER — Encounter: Payer: Self-pay | Admitting: Physician Assistant

## 2019-12-11 ENCOUNTER — Other Ambulatory Visit: Payer: Self-pay

## 2019-12-11 ENCOUNTER — Ambulatory Visit: Payer: Self-pay | Attending: Physician Assistant | Admitting: Physician Assistant

## 2019-12-11 ENCOUNTER — Encounter: Payer: Self-pay | Admitting: Licensed Clinical Social Worker

## 2019-12-11 VITALS — BP 116/76 | HR 88 | Ht 64.0 in | Wt 106.4 lb

## 2019-12-11 DIAGNOSIS — Z1331 Encounter for screening for depression: Secondary | ICD-10-CM

## 2019-12-11 DIAGNOSIS — N898 Other specified noninflammatory disorders of vagina: Secondary | ICD-10-CM

## 2019-12-11 DIAGNOSIS — R1013 Epigastric pain: Secondary | ICD-10-CM

## 2019-12-11 DIAGNOSIS — Z131 Encounter for screening for diabetes mellitus: Secondary | ICD-10-CM

## 2019-12-11 DIAGNOSIS — Z09 Encounter for follow-up examination after completed treatment for conditions other than malignant neoplasm: Secondary | ICD-10-CM

## 2019-12-11 DIAGNOSIS — F32A Depression, unspecified: Secondary | ICD-10-CM

## 2019-12-11 DIAGNOSIS — J45909 Unspecified asthma, uncomplicated: Secondary | ICD-10-CM

## 2019-12-11 LAB — GLUCOSE, POCT (MANUAL RESULT ENTRY): POC Glucose: 71 mg/dl (ref 70–99)

## 2019-12-11 LAB — POCT URINALYSIS DIP (CLINITEK)
Glucose, UA: NEGATIVE mg/dL
Leukocytes, UA: NEGATIVE
Nitrite, UA: NEGATIVE
POC PROTEIN,UA: 30 — AB
Spec Grav, UA: >=1.03 — AB (ref 1.010–1.025)
Urobilinogen, UA: 0.2 E.U./dL
pH, UA: 6 (ref 5.0–8.0)

## 2019-12-11 MED ORDER — ALBUTEROL SULFATE HFA 108 (90 BASE) MCG/ACT IN AERS: 1.0000 | INHALATION_SPRAY | Freq: Four times a day (QID) | RESPIRATORY_TRACT | 1 refills | Status: DC | PRN

## 2019-12-11 MED ORDER — ESCITALOPRAM OXALATE 10 MG PO TABS: 10.0000 mg | ORAL_TABLET | Freq: Every day | ORAL | 3 refills | Status: AC

## 2019-12-11 MED ORDER — ESCITALOPRAM OXALATE 10 MG PO TABS: 10.0000 mg | ORAL_TABLET | Freq: Every day | ORAL | 0 refills | Status: DC

## 2019-12-11 MED FILL — ALBUTEROL SULFATE HFA 108 (: 108 (90 BAS | 25 days supply | Qty: 18 | Fill #0

## 2019-12-11 MED FILL — ESCITALOPRAM 10 MG TABLET: 10 | 30 days supply | Qty: 30 | Fill #0

## 2019-12-11 NOTE — Progress Notes (Signed)
Patient ID: Laura Shaffer, female   DOB: [birth year], 29 y.o.   MRN: [id number]     Laura Shaffer, is a 29 y.o. female  OMV:[id number]  GGE:[id number]  DOB - 1990/11/10  Subjective:  Chief Complaint and HPI: Laura Shaffer is a 29 y.o. female here today to establish care and for a follow up visit after After ED visit 12/10/2019 for asthma exacerbation.  She also has several other concerns today.  Vaginal odor  "on and off for years."  Denies pelvic pain/fever.  No vaginal discharge.  Wants to have HIV testing.  Not SA in months.    Feels bad and tired all the time.  Has trouble eating and has poor appetite for years.  Denies any anorexia/bulemia.  Admits poor intake with food and water.  Stomach always feels unsettled.  No blood in stool.    Needs albuterol RF bc couldn't afford it at other pharmacy.    Admits to being depressed.  Says she has no plan or intent of harming herself.  No weapons.  She is living in a hotel with her uncle.  Not much family or friend support.  No religious or spiritual support.  But, says she would never harm herself.  She was previously on lexapro for depression and latuda for bipolar but has not either in a long time.  Says latuda caused her extreme night mares.  She is open to resuming lexapro and in meeting with the social worker today.       From ED A/P: Patient is a 29 year old female with a history of asthma who presents to the emergency department due to wheezing.  She states that her symptoms are intermittent.  Her most recent exacerbation started a few days ago.  Reports mild shortness of breath.  No fevers, rhinorrhea, URI symptoms.  No tobacco use but she smokes marijuana intermittently.  Patient also states she has been more stressed lately.  She has been sleeping at relatives homes and doesn't have stable housing. She feels very anxious because of this. Denies SI/HI. Reports mild indigestion that seems to be occurring after meals recently.   Patient  is a 29 year old female who presents with multiple complaints.  She notes a history of asthma and has not had an albuterol inhaler to use recently.  She has been experiencing intermittent wheezing.  No current wheezing on my exam.  Lungs are clear to auscultation bilaterally.  O2 saturations at 100% on room air.  Will give patient albuterol in the emergency department that she can also take home to continue using.  Will give a prescription for albuterol.  Patient also complains of mild acid reflux after meals.  She states she is extremely stressed recently and thinks this might be a factor as well.  We will discharge on a short course of Pepcid.  Patient also states that she feels quite anxious lately.  She denies SI/HI but is stressed because she does not have stable housing and has been living with relatives in the area.  Patient requests a referral for a PCP.  She is not insured.  We will give a referral to [facility].  Recommended she reach out to them tomorrow to schedule a follow-up appointment.  Return to the ED with new or worsening symptoms.  Her questions were answered and she was amicable at the time of discharge.  Her vital signs are stable.   ED/Hospital notes reviewed.   Social History: Family history:  ROS:  Constitutional:  No f/c, No night sweats, No unexplained weight loss. EENT:  No vision changes, No blurry vision, No hearing changes. No mouth, throat, or ear problems.  Respiratory: No cough, No SOB Cardiac: No CP, no palpitations GI:  No abd pain, occasional N/V GU: No Urinary s/sx Musculoskeletal: No joint pain Neuro: No headache, no dizziness, no motor weakness.  Skin: No rash Endocrine:  No polydipsia. No polyuria.  Psych: Denies SI/HI  No problems updated.  ALLERGIES: No Known Allergies  PAST MEDICAL HISTORY: Past Medical History:  Diagnosis Date  . Asthma     MEDICATIONS AT HOME: Prior to Admission medications   Medication Sig  Start Date End Date Taking? Authorizing Provider  albuterol (VENTOLIN HFA) 108 (90 Base) MCG/ACT inhaler Inhale 1-2 puffs into the lungs every 6 (six) hours as needed for wheezing or shortness of breath. 12/11/19   Anders Simmonds, PA-C  escitalopram (LEXAPRO) 10 MG tablet Take 1 tablet (10 mg total) by mouth daily. 12/11/19 01/10/20  Anders Simmonds, PA-C  famotidine (PEPCID) 20 MG tablet Take 1 tablet (20 mg total) by mouth 2 (two) times daily. Patient not taking: Reported on 12/11/2019 12/10/19   Placido Sou, PA-C  hydrOXYzine (ATARAX/VISTARIL) 25 MG tablet Take 1 tablet (25 mg total) by mouth 3 (three) times daily as needed for anxiety. Patient not taking: Reported on 12/11/2019 11/26/19   Money, Gerlene Burdock, FNP  mirtazapine (REMERON) 15 MG tablet Take 1 tablet (15 mg total) by mouth at bedtime. Patient not taking: Reported on 12/11/2019 11/26/19 12/26/19  Money, Gerlene Burdock, FNP     Objective:  EXAM:   Vitals:   12/11/19 1000  BP: 116/76  Pulse: 88  SpO2: 100%  Weight: 106 lb 6.4 oz (48.3 kg)  Height: 5\' 4"  (1.626 m)    General appearance : A&OX3. NAD. Non-toxic-appearing; very thin HEENT: Atraumatic and Normocephalic.  PERRLA. EOM intact.  TNeck: supple, no JVD. No cervical lymphadenopathy. No thyromegaly Chest/Lungs:  Breathing-non-labored, Good air entry bilaterally, breath sounds normal without rales, rhonchi, or wheezing  CVS: S1 S2 regular, no murmurs, gallops, rubs  Abdomen: Bowel sounds present, Non tender and not distended with no gaurding, rigidity or rebound. Extremities: Bilateral Lower Ext shows no edema, both legs are warm to touch with = pulse throughout Neurology:  CN II-XII grossly intact, Non focal.   Psych:  TP scattered and sluggish. J/I fair. sluggish speech. Appropriate eye contact and flat affect.  Skin:  No Rash  Depression screen PHQ 2/9 12/11/2019  Decreased Interest 3  Down, Depressed, Hopeless 3  PHQ - 2 Score 6  Altered sleeping 3  Tired,  decreased energy 3  Change in appetite 3  Feeling bad or failure about yourself  3  Trouble concentrating 3  Moving slowly or fidgety/restless 3  Suicidal thoughts 3  PHQ-9 Score 27  Difficult doing work/chores Very difficult     Data Review No results found for: HGBA1C   Assessment & Plan   1. Diabetes mellitus screening I have had a lengthy discussion and provided education about insulin resistance and the intake of too much sugar/refined carbohydrates.  I have advised the patient to work at a goal of eliminating sugary drinks, candy, desserts, sweets, refined sugars, processed foods, and white carbohydrates.  The patient expresses understanding. - Glucose (CBG)  2. Vaginal odor - POCT URINALYSIS DIP (CLINITEK) - Cervicovaginal ancillary only - HIV antibody (with reflex)  3. Mild asthma without complication, unspecified whether persistent - albuterol (VENTOLIN HFA)  108 (90 Base) MCG/ACT inhaler; Inhale 1-2 puffs into the lungs every 6 (six) hours as needed for wheezing or shortness of breath.  Dispense: 18 g; Refill: 1  4. Dyspepsia - H. pylori breath test  5. Depression, unspecified depression type - Ambulatory referral to Social Work - escitalopram (LEXAPRO) 10 MG tablet; Take 1 tablet (10 mg total) by mouth daily.  Dispense: 30 tablet; Refill: 3  6. Encounter for examination following treatment at hospital  7. Positive depression screening See #5.  Close f/up with social work - Ambulatory referral to Social Work - escitalopram (LEXAPRO) 10 MG tablet; Take 1 tablet (10 mg total) by mouth daily.  Dispense: 30 tablet; Refill: 3     Patient have been counseled extensively about nutrition and exercise  Return in about 6 weeks (around 01/22/2020) for assign PCP;  f/up depression.  The patient was given clear instructions to go to ER or return to medical center if symptoms don't improve, worsen or new problems develop. The patient verbalized understanding. The patient  was told to call to get lab results if they haven't heard anything in the next week.     Georgian Co, PA-C [facility] [place], [place] [phone]   12/11/2019, 12:35 PM

## 2019-12-11 NOTE — Patient Instructions (Signed)
Take 1/2 tablet of lexapro for the first week then increase to whole tablet.  Drink 64 to 80 ounces of water daily.     Dehydration, Adult Dehydration is condition in which there is not enough water or other fluids in the body. This happens when a person loses more fluids than he or she takes in. Important body parts cannot work right without the right amount of fluids. Any loss of fluids from the body can cause dehydration. Dehydration can be mild, worse, or very bad. It should be treated right away to keep it from getting very bad. What are the causes? This condition may be caused by:  Conditions that cause loss of water or other fluids, such as: ? Watery poop (diarrhea). ? Vomiting. ? Sweating a lot. ? Peeing (urinating) a lot.  Not drinking enough fluids, especially when you: ? Are ill. ? Are doing things that take a lot of energy to do.  Other illnesses and conditions, such as fever or infection.  Certain medicines, such as medicines that take extra fluid out of the body (diuretics).  Lack of safe drinking water.  Not being able to get enough water and food. What increases the risk? The following factors may make you more likely to develop this condition:  Having a long-term (chronic) illness that has not been treated the right way, such as: ? Diabetes. ? Heart disease. ? Kidney disease.  Being 19 years of age or older.  Having a disability.  Living in a place that is high above the ground or sea (high in altitude). The thinner, dried air causes more fluid loss.  Doing exercises that put stress on your body for a long time. What are the signs or symptoms? Symptoms of dehydration depend on how bad it is. Mild or worse dehydration  Thirst.  Dry lips or dry mouth.  Feeling dizzy or light-headed, especially when you stand up from sitting.  Muscle cramps.  Your body making: ? Dark pee (urine). Pee may be the color of tea. ? Less pee than normal. ? Less tears  than normal.  Headache. Very bad dehydration  Changes in skin. Skin may: ? Be cold to the touch (clammy). ? Be blotchy or pale. ? Not go back to normal right after you lightly pinch it and let it go.  Little or no tears, pee, or sweat.  Changes in vital signs, such as: ? Fast breathing. ? Low blood pressure. ? Weak pulse. ? Pulse that is more than 100 beats a minute when you are sitting still.  Other changes, such as: ? Feeling very thirsty. ? Eyes that look hollow (sunken). ? Cold hands and feet. ? Being mixed up (confused). ? Being very tired (lethargic) or having trouble waking from sleep. ? Short-term weight loss. ? Loss of consciousness. How is this treated? Treatment for this condition depends on how bad it is. Treatment should start right away. Do not wait until your condition gets very bad. Very bad dehydration is an emergency. You will need to go to a hospital.  Mild or worse dehydration can be treated at home. You may be asked to: ? Drink more fluids. ? Drink an oral rehydration solution (ORS). This drink helps get the right amounts of fluids and salts and minerals in the blood (electrolytes).  Very bad dehydration can be treated: ? With fluids through an IV tube. ? By getting normal levels of salts and minerals in your blood. This is often done by giving  salts and minerals through a tube. The tube is passed through your nose and into your stomach. ? By treating the root cause. Follow these instructions at home: Oral rehydration solution If told by your doctor, drink an ORS:  Make an ORS. Use instructions on the package.  Start by drinking small amounts, about  cup (120 mL) every 5-10 minutes.  Slowly drink more until you have had the amount that your doctor said to have. Eating and drinking         Drink enough clear fluid to keep your pee pale yellow. If you were told to drink an ORS, finish the ORS first. Then, start slowly drinking other clear  fluids. Drink fluids such as: ? Water. Do not drink only water. Doing that can make the salt (sodium) level in your body get too low. ? Water from ice chips you suck on. ? Fruit juice that you have added water to (diluted). ? Low-calorie sports drinks.  Eat foods that have the right amounts of salts and minerals, such as: ? Bananas. ? Oranges. ? Potatoes. ? Tomatoes. ? Spinach.  Do not drink alcohol.  Avoid: ? Drinks that have a lot of sugar. These include:  High-calorie sports drinks.  Fruit juice that you did not add water to.  Soda.  Caffeine. ? Foods that are greasy or have a lot of fat or sugar. General instructions  Take over-the-counter and prescription medicines only as told by your doctor.  Do not take salt tablets. Doing that can make the salt level in your body get too high.  Return to your normal activities as told by your doctor. Ask your doctor what activities are safe for you.  Keep all follow-up visits as told by your doctor. This is important. Contact a doctor if:  You have pain in your belly (abdomen) and the pain: ? Gets worse. ? Stays in one place.  You have a rash.  You have a stiff neck.  You get angry or annoyed (irritable) more easily than normal.  You are more tired or have a harder time waking than normal.  You feel: ? Weak or dizzy. ? Very thirsty. Get help right away if you have:  Any symptoms of very bad dehydration.  Symptoms of vomiting, such as: ? You cannot eat or drink without vomiting. ? Your vomiting gets worse or does not go away. ? Your vomit has blood or green stuff in it.  Symptoms that get worse with treatment.  A fever.  A very bad headache.  Problems with peeing or pooping (having a bowel movement), such as: ? Watery poop that gets worse or does not go away. ? Blood in your poop (stool). This may cause poop to look black and tarry. ? Not peeing in 6-8 hours. ? Peeing only a small amount of very dark pee  in 6-8 hours.  Trouble breathing. These symptoms may be an emergency. Do not wait to see if the symptoms will go away. Get medical help right away. Call your local emergency services (911 in the [place] Do not drive yourself to the hospital. Summary  Dehydration is a condition in which there is not enough water or other fluids in the body. This happens when a person loses more fluids than he or she takes in.  Treatment for this condition depends on how bad it is. Treatment should be started right away. Do not wait until your condition gets very bad.  Drink enough clear fluid to keep  your pee pale yellow. If you were told to drink an oral rehydration solution (ORS), finish the ORS first. Then, start slowly drinking other clear fluids.  Take over-the-counter and prescription medicines only as told by your doctor.  Get help right away if you have any symptoms of very bad dehydration. This information is not intended to replace advice given to you by your health care provider. Make sure you discuss any questions you have with your health care provider. Document Revised: 08/16/2018 Document Reviewed: 08/16/2018 Elsevier Patient Education  2020 Elsevier Inc.   Persistent Depressive Disorder  Persistent depressive disorder (PDD) is a mental health condition. PDD causes symptoms of low-level depression for 2 years or longer. It may also be called long-term (chronic) depression or dysthymia. PDD may include episodes of more severe depression that last for about 2 weeks (major depressive disorder or MDD). PDD can affect the way you think, feel, and sleep. This condition may also affect your relationships. You may be more likely to get sick if you have PDD. Symptoms of PDD occur for most of the day and may include:  Feeling tired (fatigue).  Low energy.  Eating too much or too little.  Sleeping too much or too little.  Feeling restless or agitated.  Feeling hopeless.  Feeling worthless or  guilty.  Feeling worried or nervous (anxiety).  Trouble concentrating or making decisions.  Low self-esteem.  A negative way of looking at things (outlook).  Not being able to have fun or feel pleasure.  Avoiding interacting with people.  Getting angry or annoyed easily (irritability).  Acting aggressive or angry. Follow these instructions at home: Activity  Go back to your normal activities as told by your doctor.  Exercise regularly as told by your doctor. General instructions  Take over-the-counter and prescription medicines only as told by your doctor.  Do not drink alcohol. Or, limit how much alcohol you drink to no more than 1 drink a day for nonpregnant women and 2 drinks a day for men. One drink equals 12 oz of beer, 5 oz of wine, or 1 oz of hard liquor. Alcohol can affect any antidepressant medicines you are taking. Talk with your doctor about your alcohol use.  Eat a healthy diet and get plenty of sleep.  Find activities that you enjoy each day.  Consider joining a support group. Your doctor may be able to suggest a support group.  Keep all follow-up visits as told by your doctor. This is important. Where to find more information [employer] on Mental Illness  www.nami.org [place] [employer] of Mental Health  http://www.maynard.net/ National Suicide Prevention Lifeline  3076355731).  This is free, 24-hour help. Contact a doctor if:  Your symptoms get worse.  You have new symptoms.  You have trouble sleeping or doing your daily activities. Get help right away if:  You self-harm.  You have serious thoughts about hurting yourself or others.  You see, hear, taste, smell, or feel things that are not there (hallucinate). This information is not intended to replace advice given to you by your health care provider. Make sure you discuss any questions you have with your health care provider. Document Revised: 12/16/2016 Document Reviewed:  08/28/2015 Elsevier Patient Education  2020 [employer].

## 2019-12-11 NOTE — Progress Notes (Signed)
Pt not able to afford medications Vaginal odor  Discharge-yellow Desires STD/HIV testing- Glucose/A1C check

## 2019-12-12 LAB — H. PYLORI BREATH TEST: H pylori Breath Test: NEGATIVE

## 2019-12-13 LAB — CERVICOVAGINAL ANCILLARY ONLY
Bacterial Vaginitis (gardnerella): POSITIVE — AB
Candida Glabrata: NEGATIVE
Candida Vaginitis: NEGATIVE
Chlamydia: NEGATIVE
Comment: NEGATIVE
Comment: NEGATIVE
Comment: NEGATIVE
Comment: NEGATIVE
Comment: NEGATIVE
Comment: NORMAL
Neisseria Gonorrhea: NEGATIVE
Trichomonas: NEGATIVE

## 2019-12-16 NOTE — Progress Notes (Signed)
LCSW introduced self after visit with Provider. LCSW explained role at [facility], in addition, shared that this LCSW assists with behavioral health and/or resource needs.   Pt endorsed difficulty managing depression and anxiety symptoms associated with Bipolar disorder triggered by current homelessness and limited support. Pt received services from [facility] where she participated in medication management for one month; however, discontinued medications "I didn't like the feeling" She attended group therapy for a couple of sessions.   Currently patient is not receiving any services. She smokes marijuana occasionally to assist with stressors; however, is interested in re-initiating therapy and med management. Pt had a scheduled appointment with [facility] on 12/10/19; however, forgot due to ongoing stress. LCSW provided patient with [facility]'s contact information and patient agreed to follow up to schedule a new appointment.   Pt was strongly encouraged to schedule an appointment with LCSW to further assess services.

## 2019-12-17 ENCOUNTER — Telehealth: Payer: Self-pay

## 2019-12-17 NOTE — Telephone Encounter (Signed)
Pt. Has reviewed lab results in My Chart.Please notify her when HIV test is back.

## 2019-12-18 ENCOUNTER — Encounter: Payer: Self-pay | Admitting: Physician Assistant

## 2019-12-18 ENCOUNTER — Other Ambulatory Visit: Payer: Self-pay | Admitting: Physician Assistant

## 2019-12-18 DIAGNOSIS — Z113 Encounter for screening for infections with a predominantly sexual mode of transmission: Secondary | ICD-10-CM

## 2019-12-18 MED ORDER — FLUCONAZOLE 150 MG PO TABS: 150.0000 mg | ORAL_TABLET | Freq: Once | ORAL | 0 refills | Status: AC

## 2019-12-18 MED ORDER — METRONIDAZOLE 500 MG PO TABS: 500.0000 mg | ORAL_TABLET | Freq: Two times a day (BID) | ORAL | 0 refills | Status: DC

## 2019-12-18 NOTE — Telephone Encounter (Signed)
Please call this patient and it looks as though the lab did not run the HIV test;  I have placed an order if she wants to return to have it drawn.  Thanks, Georgian Co, PA-C

## 2019-12-19 ENCOUNTER — Encounter: Payer: Self-pay | Admitting: *Deleted

## 2019-12-19 NOTE — Telephone Encounter (Signed)
Attempt to reach patient. No answer, unable to LVM, "Call cannot be completed".

## 2019-12-20 ENCOUNTER — Telehealth: Payer: Self-pay

## 2019-12-20 MED ORDER — METRONIDAZOLE 500 MG PO TABS
500.0000 mg | ORAL_TABLET | Freq: Two times a day (BID) | ORAL | 0 refills | Status: DC
Start: 2019-12-20 — End: 2022-01-26

## 2019-12-20 NOTE — Telephone Encounter (Signed)
Pt name and DOB verified. Patient aware of results and result note per Georgian Co, PA-C. Patient request referral to GYN specialist or Urologist since this is an ongoing issue.   Patient requested medication be sent to [facility] . Medication sent to requested pharmacy and canceled at [facility] on [address]

## 2019-12-20 NOTE — Telephone Encounter (Signed)
Copied from CRM [id number]. Topic: General - Other >> Dec 20, 2019  8:05 AM Jaquita Rector A wrote: Reason for CRM: Patient called back to speak to Guy Franco. Can be reached at Ph# [phone]

## 2019-12-24 ENCOUNTER — Telehealth (HOSPITAL_COMMUNITY): Payer: No Payment, Other

## 2019-12-25 ENCOUNTER — Telehealth: Payer: Self-pay | Admitting: Licensed Clinical Social Worker

## 2019-12-25 ENCOUNTER — Telehealth: Payer: Medicaid Other | Admitting: Family Medicine

## 2019-12-25 ENCOUNTER — Institutional Professional Consult (permissible substitution): Payer: Medicaid Other | Admitting: Licensed Clinical Social Worker

## 2019-12-25 NOTE — Telephone Encounter (Signed)
Call placed to patient regarding scheduled [facility] appointment. LCSW was unable to leave a message.

## 2020-01-16 ENCOUNTER — Ambulatory Visit (HOSPITAL_COMMUNITY): Payer: Self-pay | Admitting: Licensed Clinical Social Worker

## 2020-01-21 ENCOUNTER — Inpatient Hospital Stay: Payer: Medicaid Other | Admitting: Internal Medicine

## 2020-01-31 ENCOUNTER — Ambulatory Visit (LOCAL_COMMUNITY_HEALTH_CENTER): Payer: Medicaid Other | Admitting: Advanced Practice Midwife

## 2020-01-31 ENCOUNTER — Encounter: Payer: Self-pay | Admitting: Advanced Practice Midwife

## 2020-01-31 ENCOUNTER — Ambulatory Visit: Payer: Self-pay

## 2020-01-31 ENCOUNTER — Other Ambulatory Visit: Payer: Self-pay

## 2020-01-31 VITALS — BP 115/70 | Ht 64.0 in | Wt 107.0 lb

## 2020-01-31 DIAGNOSIS — F1290 Cannabis use, unspecified, uncomplicated: Secondary | ICD-10-CM

## 2020-01-31 DIAGNOSIS — Z3009 Encounter for other general counseling and advice on contraception: Secondary | ICD-10-CM

## 2020-01-31 DIAGNOSIS — J45909 Unspecified asthma, uncomplicated: Secondary | ICD-10-CM

## 2020-01-31 DIAGNOSIS — S0990XS Unspecified injury of head, sequela: Secondary | ICD-10-CM

## 2020-01-31 DIAGNOSIS — Z5989 Other problems related to housing and economic circumstances: Secondary | ICD-10-CM | POA: Insufficient documentation

## 2020-01-31 DIAGNOSIS — S0990XA Unspecified injury of head, initial encounter: Secondary | ICD-10-CM | POA: Insufficient documentation

## 2020-01-31 DIAGNOSIS — F319 Bipolar disorder, unspecified: Secondary | ICD-10-CM

## 2020-01-31 LAB — WET PREP FOR TRICH, YEAST, CLUE
Trichomonas Exam: NEGATIVE
Yeast Exam: NEGATIVE

## 2020-01-31 LAB — HEMOGLOBIN, FINGERSTICK: Hemoglobin: 13 g/dL (ref 11.1–15.9)

## 2020-01-31 MED ORDER — METRONIDAZOLE 500 MG PO TABS: 500.0000 mg | ORAL_TABLET | Freq: Two times a day (BID) | ORAL | 0 refills | Status: AC

## 2020-01-31 NOTE — Progress Notes (Signed)
Pt is here for physical, STI screening, pap. Pt reports uses condoms sometimes for birth control but reports has been abstinent and last sex was ~10/2019. Pt filling out PHQ9. Latex free condoms given to pt per pt request.

## 2020-01-31 NOTE — Progress Notes (Addendum)
Hgb 13.0 and reviewed result with provider. Wet mount reviewed with provider and pt received treatment for BV per E. Sciora, CNM verbal order as pt is symptomatic. Pt received Metronidazole 500mg  #14, take 1 tab po BID x7 days per , CNM verbal order. Hazle Coca, LCSW card and cardinal card with contact info given to pt per pt request and provider verbal order. Pt denies any thoughts of harming herself or anyone else. Pt aware of when to call 911 or seek help immediately and pt states understanding. Kathreen Cosier to Assistance given to pt per provider verbal order. PCP list given to pt. Counseled pt per provider orders and pt states understanding of counseling. Pt aware that if she changes her mind and desires a hormonal birth control method later on to give [facility] a call so she can RTC and pt states understanding. Pt request COVID vaccine today, so walked pt down to COVID vaccine clinic per pt request. Provider orders completed.
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
## 2020-02-05 LAB — IGP, RFX APTIMA HPV ASCU: PAP Smear Comment: 0

## 2020-02-13 ENCOUNTER — Encounter: Payer: Self-pay | Admitting: Student

## 2020-02-13 LAB — HM HEPATITIS C SCREENING LAB: HM Hepatitis Screen: NEGATIVE

## 2020-02-13 LAB — HM HIV SCREENING LAB: HM HIV Screening: NEGATIVE

## 2020-02-26 ENCOUNTER — Other Ambulatory Visit: Payer: Self-pay

## 2020-02-26 ENCOUNTER — Ambulatory Visit: Payer: Medicaid Other | Attending: Family Medicine | Admitting: Nurse Practitioner

## 2020-02-27 ENCOUNTER — Ambulatory Visit: Payer: Medicaid Other | Admitting: Internal Medicine

## 2020-07-15 ENCOUNTER — Encounter: Payer: Self-pay | Admitting: Physician Assistant

## 2020-07-15 ENCOUNTER — Ambulatory Visit: Payer: Medicaid Other | Admitting: Physician Assistant

## 2020-07-15 ENCOUNTER — Other Ambulatory Visit: Payer: Self-pay

## 2020-07-15 DIAGNOSIS — Z113 Encounter for screening for infections with a predominantly sexual mode of transmission: Secondary | ICD-10-CM

## 2020-07-15 DIAGNOSIS — Z7189 Other specified counseling: Secondary | ICD-10-CM

## 2020-07-15 LAB — WET PREP FOR TRICH, YEAST, CLUE
Trichomonas Exam: NEGATIVE
Yeast Exam: NEGATIVE

## 2020-07-15 LAB — PREGNANCY, URINE: Preg Test, Ur: NEGATIVE
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
## 2020-07-17 LAB — HM HIV SCREENING LAB: HM HIV Screening: NEGATIVE

## 2020-07-23 ENCOUNTER — Encounter: Payer: Self-pay | Admitting: Physician Assistant

## 2021-01-05 DIAGNOSIS — Z1152 Encounter for screening for COVID-19: Secondary | ICD-10-CM | POA: Diagnosis not present

## 2021-02-12 DIAGNOSIS — Z1152 Encounter for screening for COVID-19: Secondary | ICD-10-CM | POA: Diagnosis not present

## 2021-08-13 DIAGNOSIS — Z1388 Encounter for screening for disorder due to exposure to contaminants: Secondary | ICD-10-CM | POA: Diagnosis not present

## 2021-08-13 DIAGNOSIS — Z3009 Encounter for other general counseling and advice on contraception: Secondary | ICD-10-CM | POA: Diagnosis not present

## 2021-08-13 DIAGNOSIS — Z0389 Encounter for observation for other suspected diseases and conditions ruled out: Secondary | ICD-10-CM | POA: Diagnosis not present

## 2021-08-13 DIAGNOSIS — N898 Other specified noninflammatory disorders of vagina: Secondary | ICD-10-CM | POA: Diagnosis not present

## 2021-08-13 DIAGNOSIS — Z3482 Encounter for supervision of other normal pregnancy, second trimester: Secondary | ICD-10-CM | POA: Diagnosis not present

## 2021-09-27 DIAGNOSIS — Z9149 Other personal history of psychological trauma, not elsewhere classified: Secondary | ICD-10-CM | POA: Diagnosis not present

## 2021-09-27 DIAGNOSIS — Z3482 Encounter for supervision of other normal pregnancy, second trimester: Secondary | ICD-10-CM | POA: Diagnosis not present

## 2021-09-27 DIAGNOSIS — B379 Candidiasis, unspecified: Secondary | ICD-10-CM | POA: Diagnosis not present

## 2021-09-27 DIAGNOSIS — Z5901 Sheltered homelessness: Secondary | ICD-10-CM | POA: Diagnosis not present

## 2021-10-28 DIAGNOSIS — N898 Other specified noninflammatory disorders of vagina: Secondary | ICD-10-CM | POA: Diagnosis not present

## 2021-10-28 DIAGNOSIS — U071 COVID-19: Secondary | ICD-10-CM | POA: Diagnosis not present

## 2021-10-28 DIAGNOSIS — Z3482 Encounter for supervision of other normal pregnancy, second trimester: Secondary | ICD-10-CM | POA: Diagnosis not present

## 2021-11-10 DIAGNOSIS — I959 Hypotension, unspecified: Secondary | ICD-10-CM | POA: Diagnosis not present

## 2021-11-10 DIAGNOSIS — O99891 Other specified diseases and conditions complicating pregnancy: Secondary | ICD-10-CM | POA: Diagnosis not present

## 2021-11-10 DIAGNOSIS — R55 Syncope and collapse: Secondary | ICD-10-CM | POA: Diagnosis not present

## 2021-11-10 DIAGNOSIS — O99412 Diseases of the circulatory system complicating pregnancy, second trimester: Secondary | ICD-10-CM | POA: Diagnosis not present

## 2021-11-10 DIAGNOSIS — O26892 Other specified pregnancy related conditions, second trimester: Secondary | ICD-10-CM | POA: Diagnosis not present

## 2021-11-10 DIAGNOSIS — Z3A27 27 weeks gestation of pregnancy: Secondary | ICD-10-CM | POA: Diagnosis not present

## 2021-11-10 DIAGNOSIS — M542 Cervicalgia: Secondary | ICD-10-CM | POA: Diagnosis not present

## 2021-11-10 DIAGNOSIS — R9431 Abnormal electrocardiogram [ECG] [EKG]: Secondary | ICD-10-CM | POA: Diagnosis not present

## 2021-11-10 DIAGNOSIS — E860 Dehydration: Secondary | ICD-10-CM | POA: Diagnosis not present

## 2021-11-10 DIAGNOSIS — F319 Bipolar disorder, unspecified: Secondary | ICD-10-CM | POA: Diagnosis not present

## 2021-11-10 DIAGNOSIS — O99282 Endocrine, nutritional and metabolic diseases complicating pregnancy, second trimester: Secondary | ICD-10-CM | POA: Diagnosis not present

## 2021-11-10 DIAGNOSIS — F418 Other specified anxiety disorders: Secondary | ICD-10-CM | POA: Diagnosis not present

## 2021-11-10 DIAGNOSIS — R0902 Hypoxemia: Secondary | ICD-10-CM | POA: Diagnosis not present

## 2021-11-10 DIAGNOSIS — O99342 Other mental disorders complicating pregnancy, second trimester: Secondary | ICD-10-CM | POA: Diagnosis not present

## 2021-11-10 DIAGNOSIS — R110 Nausea: Secondary | ICD-10-CM | POA: Diagnosis not present

## 2021-11-10 DIAGNOSIS — I498 Other specified cardiac arrhythmias: Secondary | ICD-10-CM | POA: Diagnosis not present

## 2021-11-10 DIAGNOSIS — O99512 Diseases of the respiratory system complicating pregnancy, second trimester: Secondary | ICD-10-CM | POA: Diagnosis not present

## 2021-11-10 DIAGNOSIS — W19XXXA Unspecified fall, initial encounter: Secondary | ICD-10-CM | POA: Diagnosis not present

## 2021-11-10 DIAGNOSIS — O9A212 Injury, poisoning and certain other consequences of external causes complicating pregnancy, second trimester: Secondary | ICD-10-CM | POA: Diagnosis not present

## 2021-11-10 DIAGNOSIS — R42 Dizziness and giddiness: Secondary | ICD-10-CM | POA: Diagnosis not present

## 2021-11-10 DIAGNOSIS — J45909 Unspecified asthma, uncomplicated: Secondary | ICD-10-CM | POA: Diagnosis not present

## 2021-11-12 DIAGNOSIS — D649 Anemia, unspecified: Secondary | ICD-10-CM | POA: Diagnosis not present

## 2021-11-12 DIAGNOSIS — R0602 Shortness of breath: Secondary | ICD-10-CM | POA: Diagnosis not present

## 2021-11-12 DIAGNOSIS — R9431 Abnormal electrocardiogram [ECG] [EKG]: Secondary | ICD-10-CM | POA: Diagnosis not present

## 2021-11-12 DIAGNOSIS — R55 Syncope and collapse: Secondary | ICD-10-CM | POA: Diagnosis not present

## 2021-11-25 DIAGNOSIS — O99323 Drug use complicating pregnancy, third trimester: Secondary | ICD-10-CM | POA: Diagnosis not present

## 2021-11-25 DIAGNOSIS — Z3483 Encounter for supervision of other normal pregnancy, third trimester: Secondary | ICD-10-CM | POA: Diagnosis not present

## 2021-12-08 DIAGNOSIS — O99013 Anemia complicating pregnancy, third trimester: Secondary | ICD-10-CM | POA: Diagnosis not present

## 2021-12-08 DIAGNOSIS — Z2839 Other underimmunization status: Secondary | ICD-10-CM | POA: Diagnosis not present

## 2021-12-08 DIAGNOSIS — Z23 Encounter for immunization: Secondary | ICD-10-CM | POA: Diagnosis not present

## 2021-12-08 DIAGNOSIS — O09893 Supervision of other high risk pregnancies, third trimester: Secondary | ICD-10-CM | POA: Diagnosis not present

## 2021-12-21 DIAGNOSIS — O0993 Supervision of high risk pregnancy, unspecified, third trimester: Secondary | ICD-10-CM | POA: Diagnosis not present

## 2021-12-21 DIAGNOSIS — Z23 Encounter for immunization: Secondary | ICD-10-CM | POA: Diagnosis not present

## 2021-12-28 DIAGNOSIS — R55 Syncope and collapse: Secondary | ICD-10-CM | POA: Diagnosis not present

## 2021-12-28 DIAGNOSIS — O99891 Other specified diseases and conditions complicating pregnancy: Secondary | ICD-10-CM | POA: Diagnosis not present

## 2021-12-28 DIAGNOSIS — R9431 Abnormal electrocardiogram [ECG] [EKG]: Secondary | ICD-10-CM | POA: Diagnosis not present

## 2022-01-04 DIAGNOSIS — F32A Depression, unspecified: Secondary | ICD-10-CM | POA: Diagnosis not present

## 2022-01-04 DIAGNOSIS — O99013 Anemia complicating pregnancy, third trimester: Secondary | ICD-10-CM | POA: Diagnosis not present

## 2022-01-04 DIAGNOSIS — O0993 Supervision of high risk pregnancy, unspecified, third trimester: Secondary | ICD-10-CM | POA: Diagnosis not present

## 2022-01-04 DIAGNOSIS — O99340 Other mental disorders complicating pregnancy, unspecified trimester: Secondary | ICD-10-CM | POA: Diagnosis not present

## 2022-01-04 DIAGNOSIS — Z87898 Personal history of other specified conditions: Secondary | ICD-10-CM | POA: Diagnosis not present

## 2022-01-04 DIAGNOSIS — Z59819 Housing instability, housed unspecified: Secondary | ICD-10-CM | POA: Diagnosis not present

## 2022-01-07 DIAGNOSIS — O99891 Other specified diseases and conditions complicating pregnancy: Secondary | ICD-10-CM | POA: Diagnosis not present

## 2022-01-07 DIAGNOSIS — O98513 Other viral diseases complicating pregnancy, third trimester: Secondary | ICD-10-CM | POA: Diagnosis not present

## 2022-01-07 DIAGNOSIS — Z1152 Encounter for screening for COVID-19: Secondary | ICD-10-CM | POA: Diagnosis not present

## 2022-01-07 DIAGNOSIS — R1110 Vomiting, unspecified: Secondary | ICD-10-CM | POA: Diagnosis not present

## 2022-01-07 DIAGNOSIS — O219 Vomiting of pregnancy, unspecified: Secondary | ICD-10-CM | POA: Diagnosis not present

## 2022-01-07 DIAGNOSIS — Z3A30 30 weeks gestation of pregnancy: Secondary | ICD-10-CM | POA: Diagnosis not present

## 2022-01-07 DIAGNOSIS — M549 Dorsalgia, unspecified: Secondary | ICD-10-CM | POA: Diagnosis not present

## 2022-01-13 ENCOUNTER — Telehealth: Payer: Self-pay | Admitting: Obstetrics and Gynecology

## 2022-01-13 NOTE — Telephone Encounter (Addendum)
I contacted patient via phone. We have received records from patient. We haven't seen this patient since [facility] 2018. Patient would be a New patient since it has been more then three years and there is a hold on new patient Gyn due to our practice combining in 10/17/21. I attempt to reach patient via phone. There was no answer and voicemail is not set up.

## 2022-01-17 NOTE — L&D Delivery Note (Signed)
Delivery Summary for Laura Shaffer  Labor Events:   Preterm labor: No data found  Rupture date: 01/24/2022  Rupture time: 11:05 AM  Rupture type: Artificial Intact  Fluid Color: Clear  Induction: No data found  Augmentation: No data found  Complications: No data found  Cervical ripening: No data found No data found   No data found     Delivery:   Episiotomy: No data found  Lacerations: No data found  Repair suture: No data found  Repair # of packets: No data found  Blood loss (ml): No data found   Information for the patient's newbornYasmina, [place] [[id number]]   Delivery 01/24/2022 11:38 AM by  Vaginal, Spontaneous Sex:  female Gestational Age: [redacted]w[redacted]d Delivery Clinician:   Living?:         APGARS  One minute Five minutes Ten minutes  Skin color:        Heart rate:        Grimace:        Muscle tone:        Breathing:        Totals: 8  10      Presentation/position:      Resuscitation:   Cord information:    Disposition of cord blood:     Blood gases sent?  Complications:   Placenta: Delivered:       appearance Newborn Measurements: Weight: 5 lb 8.5 oz (2510 g)  Height: 16.34"  Head circumference:    Chest circumference:    Other providers:    Additional  information: Forceps:   Vacuum:   Breech:   Observed anomalies       Delivery Note At 11:38 AM a viable and healthy female was delivered via Vaginal, Spontaneous (Presentation: Vertex; ROA position).  APGAR: 8, 10; weight 2510 grams.   Placenta status: Spontaneous, Intact.  Cord: 3 vessels with the following complications: None.  Cord pH: not obtained  Anesthesia: None Episiotomy: None Lacerations: None Suture Repair:  None Est. Blood Loss (mL):  300  Mom to postpartum.  Baby to Couplet care / Skin to Skin.  Rubie Maid, MD 01/24/2022, 11:54 AM

## 2022-01-19 NOTE — Telephone Encounter (Signed)
"  Call couldn't be completed as dial."  

## 2022-01-24 ENCOUNTER — Inpatient Hospital Stay
Admission: EM | Admit: 2022-01-24 | Discharge: 2022-01-26 | DRG: 807 | Disposition: A | Discharge status: Home / Self Care | Payer: Medicaid Other | Attending: Obstetrics and Gynecology | Admitting: Obstetrics and Gynecology

## 2022-01-24 ENCOUNTER — Other Ambulatory Visit: Payer: Self-pay

## 2022-01-24 DIAGNOSIS — Z5900 Homelessness unspecified: Secondary | ICD-10-CM | POA: Diagnosis not present

## 2022-01-24 DIAGNOSIS — J45909 Unspecified asthma, uncomplicated: Secondary | ICD-10-CM | POA: Diagnosis not present

## 2022-01-24 DIAGNOSIS — O9952 Diseases of the respiratory system complicating childbirth: Secondary | ICD-10-CM | POA: Diagnosis not present

## 2022-01-24 DIAGNOSIS — O99344 Other mental disorders complicating childbirth: Secondary | ICD-10-CM | POA: Diagnosis not present

## 2022-01-24 DIAGNOSIS — O479 False labor, unspecified: Secondary | ICD-10-CM | POA: Diagnosis not present

## 2022-01-24 DIAGNOSIS — R1111 Vomiting without nausea: Secondary | ICD-10-CM | POA: Diagnosis not present

## 2022-01-24 DIAGNOSIS — Z8616 Personal history of COVID-19: Secondary | ICD-10-CM

## 2022-01-24 DIAGNOSIS — F419 Anxiety disorder, unspecified: Secondary | ICD-10-CM | POA: Diagnosis present

## 2022-01-24 DIAGNOSIS — O0973 Supervision of high risk pregnancy due to social problems, third trimester: Secondary | ICD-10-CM | POA: Diagnosis not present

## 2022-01-24 DIAGNOSIS — Z3A38 38 weeks gestation of pregnancy: Secondary | ICD-10-CM | POA: Diagnosis not present

## 2022-01-24 DIAGNOSIS — F32A Depression, unspecified: Secondary | ICD-10-CM | POA: Diagnosis present

## 2022-01-24 DIAGNOSIS — Z599 Problem related to housing and economic circumstances, unspecified: Secondary | ICD-10-CM

## 2022-01-24 LAB — TYPE AND SCREEN
ABO/RH(D): A POS
Antibody Screen: NEGATIVE

## 2022-01-24 LAB — URINE DRUG SCREEN, QUALITATIVE (ARMC ONLY)
Amphetamines, Ur Screen: NOT DETECTED
Barbiturates, Ur Screen: NOT DETECTED
Benzodiazepine, Ur Scrn: NOT DETECTED
Cannabinoid 50 Ng, Ur ~~LOC~~: NOT DETECTED
Cocaine Metabolite,Ur ~~LOC~~: NOT DETECTED
MDMA (Ecstasy)Ur Screen: NOT DETECTED
Methadone Scn, Ur: NOT DETECTED
Opiate, Ur Screen: NOT DETECTED
Phencyclidine (PCP) Ur S: NOT DETECTED
Tricyclic, Ur Screen: NOT DETECTED

## 2022-01-24 LAB — CBC
HCT: 32 % — ABNORMAL LOW (ref 36.0–46.0)
Hemoglobin: 10.5 g/dL — ABNORMAL LOW (ref 12.0–15.0)
MCH: 29.6 pg (ref 26.0–34.0)
MCHC: 32.8 g/dL (ref 30.0–36.0)
MCV: 90.1 fL (ref 80.0–100.0)
Platelets: 149 10*3/uL — ABNORMAL LOW (ref 150–400)
RBC: 3.55 MIL/uL — ABNORMAL LOW (ref 3.87–5.11)
RDW: 12.6 % (ref 11.5–15.5)
WBC: 11.1 10*3/uL — ABNORMAL HIGH (ref 4.0–10.5)
nRBC: 0 % (ref 0.0–0.2)

## 2022-01-24 LAB — ABO/RH: ABO/RH(D): A POS

## 2022-01-24 MED ORDER — ACETAMINOPHEN 325 MG PO TABS
650.0000 mg | ORAL_TABLET | ORAL | Status: DC | PRN
  Administered 2022-01-25: 650 mg via ORAL
  Filled 2022-01-24 (×2): qty 2

## 2022-01-24 MED ORDER — TETANUS-DIPHTH-ACELL PERTUSSIS 5-2.5-18.5 LF-MCG/0.5 IM SUSY: 0.5000 mL | PREFILLED_SYRINGE | Freq: Once | INTRAMUSCULAR | Status: DC

## 2022-01-24 MED ORDER — ZOLPIDEM TARTRATE 5 MG PO TABS: 5.0000 mg | ORAL_TABLET | Freq: Every evening | ORAL | Status: DC | PRN

## 2022-01-24 MED ORDER — ONDANSETRON HCL 4 MG PO TABS: 4.0000 mg | ORAL_TABLET | ORAL | Status: DC | PRN

## 2022-01-24 MED ORDER — BENZOCAINE-MENTHOL 20-0.5 % EX AERO
1.0000 | INHALATION_SPRAY | CUTANEOUS | Status: DC | PRN
  Administered 2022-01-24: 1 via TOPICAL
  Filled 2022-01-24: qty 56

## 2022-01-24 MED ORDER — ONDANSETRON HCL 4 MG/2ML IJ SOLN: 4.0000 mg | INTRAMUSCULAR | Status: DC | PRN

## 2022-01-24 MED ORDER — LACTATED RINGERS IV BOLUS
1000.0000 mL | Freq: Once | INTRAVENOUS | Status: AC
  Administered 2022-01-24: 1000 mL via INTRAVENOUS

## 2022-01-24 MED ORDER — ACETAMINOPHEN 325 MG PO TABS: 650.0000 mg | ORAL_TABLET | ORAL | Status: DC | PRN

## 2022-01-24 MED ORDER — LIDOCAINE HCL (PF) 1 % IJ SOLN
INTRAMUSCULAR | Status: AC
  Filled 2022-01-24: qty 30

## 2022-01-24 MED ORDER — PRENATAL MULTIVITAMIN CH
1.0000 | ORAL_TABLET | Freq: Every day | ORAL | Status: DC
  Administered 2022-01-25 – 2022-01-26 (×2): 1 via ORAL
  Filled 2022-01-24 (×2): qty 1

## 2022-01-24 MED ORDER — LACTATED RINGERS IV SOLN: INTRAVENOUS | Status: DC

## 2022-01-24 MED ORDER — OXYCODONE-ACETAMINOPHEN 5-325 MG PO TABS: 1.0000 | ORAL_TABLET | ORAL | Status: DC | PRN

## 2022-01-24 MED ORDER — OXYTOCIN BOLUS FROM INFUSION: 333.0000 mL | Freq: Once | INTRAVENOUS | Status: DC

## 2022-01-24 MED ORDER — OXYCODONE-ACETAMINOPHEN 5-325 MG PO TABS: 2.0000 | ORAL_TABLET | ORAL | Status: DC | PRN

## 2022-01-24 MED ORDER — IBUPROFEN 600 MG PO TABS
600.0000 mg | ORAL_TABLET | Freq: Four times a day (QID) | ORAL | Status: DC
  Administered 2022-01-24 – 2022-01-26 (×8): 600 mg via ORAL
  Filled 2022-01-24 (×8): qty 1

## 2022-01-24 MED ORDER — ONDANSETRON HCL 4 MG/2ML IJ SOLN: 4.0000 mg | Freq: Four times a day (QID) | INTRAMUSCULAR | Status: DC | PRN

## 2022-01-24 MED ORDER — OXYTOCIN-SODIUM CHLORIDE 30-0.9 UT/500ML-% IV SOLN: 2.5000 [IU]/h | INTRAVENOUS | Status: DC

## 2022-01-24 MED ORDER — OXYTOCIN-SODIUM CHLORIDE 30-0.9 UT/500ML-% IV SOLN
INTRAVENOUS | Status: AC
  Filled 2022-01-24: qty 500

## 2022-01-24 MED ORDER — DIBUCAINE (PERIANAL) 1 % EX OINT: 1.0000 | TOPICAL_OINTMENT | CUTANEOUS | Status: DC | PRN

## 2022-01-24 MED ORDER — COCONUT OIL OIL: 1.0000 | TOPICAL_OIL | Status: DC | PRN

## 2022-01-24 MED ORDER — TERBUTALINE SULFATE 1 MG/ML IJ SOLN
INTRAMUSCULAR | Status: AC
  Filled 2022-01-24: qty 1

## 2022-01-24 MED ORDER — LIDOCAINE HCL (PF) 1 % IJ SOLN: 30.0000 mL | INTRAMUSCULAR | Status: DC | PRN

## 2022-01-24 MED ORDER — DIPHENHYDRAMINE HCL 25 MG PO CAPS: 25.0000 mg | ORAL_CAPSULE | Freq: Four times a day (QID) | ORAL | Status: DC | PRN

## 2022-01-24 MED ORDER — SIMETHICONE 80 MG PO CHEW: 80.0000 mg | CHEWABLE_TABLET | ORAL | Status: DC | PRN

## 2022-01-24 MED ORDER — AMMONIA AROMATIC IN INHA
RESPIRATORY_TRACT | Status: AC
  Filled 2022-01-24: qty 10

## 2022-01-24 MED ORDER — SENNOSIDES-DOCUSATE SODIUM 8.6-50 MG PO TABS
2.0000 | ORAL_TABLET | Freq: Every day | ORAL | Status: DC
  Administered 2022-01-25 – 2022-01-26 (×2): 2 via ORAL
  Filled 2022-01-24 (×2): qty 2

## 2022-01-24 MED ORDER — LACTATED RINGERS IV SOLN: 500.0000 mL | INTRAVENOUS | Status: DC | PRN

## 2022-01-24 MED ORDER — MISOPROSTOL 200 MCG PO TABS
ORAL_TABLET | ORAL | Status: AC
  Filled 2022-01-24: qty 4

## 2022-01-24 MED ORDER — SOD CITRATE-CITRIC ACID 500-334 MG/5ML PO SOLN: 30.0000 mL | ORAL | Status: DC | PRN

## 2022-01-24 MED ORDER — WITCH HAZEL-GLYCERIN EX PADS
1.0000 | MEDICATED_PAD | CUTANEOUS | Status: DC | PRN
  Filled 2022-01-24: qty 100

## 2022-01-24 MED ORDER — TERBUTALINE SULFATE 1 MG/ML IJ SOLN
0.2500 mg | Freq: Once | INTRAMUSCULAR | Status: AC
  Administered 2022-01-24: 0.25 mg via SUBCUTANEOUS

## 2022-01-24 MED ORDER — OXYTOCIN 10 UNIT/ML IJ SOLN
INTRAMUSCULAR | Status: AC
  Filled 2022-01-24: qty 2
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
## 2022-01-25 DIAGNOSIS — O0973 Supervision of high risk pregnancy due to social problems, third trimester: Secondary | ICD-10-CM | POA: Diagnosis not present

## 2022-01-25 DIAGNOSIS — Z5900 Homelessness unspecified: Secondary | ICD-10-CM | POA: Diagnosis not present

## 2022-01-25 DIAGNOSIS — Z3A38 38 weeks gestation of pregnancy: Secondary | ICD-10-CM | POA: Diagnosis not present

## 2022-01-25 LAB — CBC WITH DIFFERENTIAL/PLATELET
Abs Immature Granulocytes: 0.07 10*3/uL (ref 0.00–0.07)
Basophils Absolute: 0 10*3/uL (ref 0.0–0.1)
Basophils Relative: 0 %
Eosinophils Absolute: 0.2 10*3/uL (ref 0.0–0.5)
Eosinophils Relative: 1 %
HCT: 29.1 % — ABNORMAL LOW (ref 36.0–46.0)
Hemoglobin: 9.7 g/dL — ABNORMAL LOW (ref 12.0–15.0)
Immature Granulocytes: 0 %
Lymphocytes Relative: 12 %
Lymphs Abs: 2.1 10*3/uL (ref 0.7–4.0)
MCH: 29.7 pg (ref 26.0–34.0)
MCHC: 33.3 g/dL (ref 30.0–36.0)
MCV: 89 fL (ref 80.0–100.0)
Monocytes Absolute: 1.3 10*3/uL — ABNORMAL HIGH (ref 0.1–1.0)
Monocytes Relative: 8 %
Neutro Abs: 13.7 10*3/uL — ABNORMAL HIGH (ref 1.7–7.7)
Neutrophils Relative %: 79 %
Platelets: 152 10*3/uL (ref 150–400)
RBC: 3.27 MIL/uL — ABNORMAL LOW (ref 3.87–5.11)
RDW: 12.6 % (ref 11.5–15.5)
WBC: 17.4 10*3/uL — ABNORMAL HIGH (ref 4.0–10.5)
nRBC: 0 % (ref 0.0–0.2)

## 2022-01-25 LAB — RPR: RPR Ser Ql: NONREACTIVE

## 2022-01-25 NOTE — Clinical Social Work Maternal (Addendum)
CLINICAL SOCIAL WORK MATERNAL/CHILD NOTE  Patient Details  Name: Laura Shaffer MRN: [id number] Date of Birth: 04/10/1990  Date:  01/25/2022  Clinical Social Worker Initiating Note:  Doran Clay RN BSN Case Manager Date/Time: Initiated:  01/25/22/0930     Child's Name:  Laura Shaffer   Biological Parents:  Mother, Father   Need for Interpreter:  None   Reason for Referral:  Homelessness, Behavioral Health Concerns   Address:  [address] [place] [address]    Phone number:  [phone]    Additional phone number:   Household Members/Support Persons (HM/SP):   Household Member/Support Person 1, Household Member/Support Person 2   HM/SP Name Relationship DOB or Age  HM/SP -1 Shonda Stenerson Stepmother    HM/SP -Gideon father    HM/SP -3        HM/SP -4        HM/SP -5        HM/SP -6        HM/SP -7        HM/SP -8          Natural Supports (not living in the home):  Immediate Family   Professional Supports:     Employment:     Type of Work:     Education:  Programmer, systems   Homebound arranged:    Museum/gallery curator Resources:  Medicaid   Other Resources:  [facility]   Cultural/Religious Considerations Which May Impact Care:  NA  Strengths:  Home prepared for child  , Ability to meet basic needs     Psychotropic Medications:         Pediatrician:       Pediatrician List:   [facility]      Pediatrician Fax Number:    Risk Factors/Current Problems:  Mental Health Concerns  , Other (Comment) (stable living situation)   Cognitive State:  Alert  , Able to Concentrate     Mood/Affect:  Calm  , Comfortable  , Happy     CSW Assessment: TOC consult for homeless issues and [place] score of 11.  RNCM met with mother of newborn at the bedside, her sister is also present in the room.  Mother of baby reports that she is currently living with  her father and his wife.  The father of the baby Elana Alm is not currently involved and they are not on speaking terms.  Patient has a history of depression and says that she has been on medications for bipolar in the past but that currently she is on no medications and prefers to just deal with things on her own.  Discussed signs and symptoms of postpartum depression and will provide her with Post partum depression progress sheet on her discharge paperwork.  She agrees to a [facility] referral, RNCM will send referral over to the [facility] Department.  Patient reports that she has everything she needs at home for the baby.  Her father will be providing her transportation.  She has not chosen a Lexicographer yet. Encouraged her to make an appointment with the [facility] office, she says that she will.  Patient reports that she adequate family support.  Mother of newborn did agree to a [facility] referral.   Plan for patient to discharge tomorrow, encouraged her to reach out if she has any further  questions.   CSW Plan/Description:  No Further Intervention Required/No Barriers to Discharge, Other Information/Referral to [facility] Lajoyce Lauber, RN 01/25/2022, 4:19 PM

## 2022-01-25 NOTE — Progress Notes (Signed)
Progress Note - Vaginal Delivery  Laura Shaffer is a 32 y.o. E0P2330 now PP day 1 s/p Vaginal, Spontaneous .   Subjective:  The patient reports no complaints, up ad lib, voiding, and tolerating PO   Objective:  Vital signs in last 24 hours: Temp:  [97.5 F (36.4 C)-98.5 F (36.9 C)] 97.7 F (36.5 C) (01/09 0330) Pulse Rate:  [70-110] 90 (01/09 0317) Resp:  [18-20] 18 (01/09 0317) BP: (105-141)/(49-96) 105/66 (01/09 0317) SpO2:  [98 %-100 %] 100 % (01/09 0317) Weight:  [52.2 kg] 52.2 kg (01/08 1033)  Physical Exam:  General: alert, cooperative, appears stated age, and fatigued Lochia: appropriate Uterine Fundus: firm @u      Data Review Recent Labs    01/24/22 1036 01/25/22 0443  HGB 10.5* 9.7*  HCT 32.0* 29.1*    Assessment/Plan: Principal Problem:   [redacted] weeks gestation of pregnancy   Plan for discharge tomorrow Bottle feeding   -- Continue routine PP care.    Philip Aspen, CNM  01/25/2022 8:17 AM

## 2022-01-26 DIAGNOSIS — Z599 Problem related to housing and economic circumstances, unspecified: Secondary | ICD-10-CM | POA: Diagnosis not present

## 2022-01-26 DIAGNOSIS — Z3A38 38 weeks gestation of pregnancy: Secondary | ICD-10-CM | POA: Diagnosis not present

## 2022-01-26 DIAGNOSIS — O0973 Supervision of high risk pregnancy due to social problems, third trimester: Secondary | ICD-10-CM | POA: Diagnosis not present

## 2022-01-26 NOTE — Discharge Summary (Signed)
OB Discharge Summary     Patient Name: Laura Shaffer DOB: 1990/08/06 MRN: [id number]  Date of admission: 01/24/2022 Delivering MD: Rubie Maid  Date of Delivery: 01/24/2022  Date of discharge: 01/26/2022  Admitting diagnosis: [redacted] weeks gestation of pregnancy [Z3A.38] Intrauterine pregnancy: [redacted]w[redacted]d     Secondary diagnosis: None     Discharge diagnosis: Term Pregnancy Delivered                                                                                                Post partum procedures: none  Augmentation: N/A  Complications: None  Hospital course:  Onset of Labor With Vaginal Delivery      32 y.o. yo G2P1011 at [redacted]w[redacted]d was admitted in Active Labor on 01/24/2022. Labor course was complicated by NA  Membrane Rupture Time/Date: 11:05 AM ,01/24/2022   Delivery Method:Vaginal, Spontaneous  Episiotomy: None  Lacerations:  None  See delivery note for details  Patient had a postpartum course complicated by NA.  She is tolerating a regular diet, her pain is controlled with PO medication, she is ambulating and voiding without difficulty. She is living with her father and reports good support system. She is encouraged to get adequate sleep. She is encouraged to keep supportive people close to her.   Patient is discharged home in stable condition on 01/26/22.  Newborn Data: Birth date:01/24/2022  Birth time:11:38 AM  Gender:Female   [place] Living status:Living  Apgars:8 ,10  Weight:2510 g   Physical exam  Vitals:   01/25/22 0820 01/25/22 1522 01/25/22 2322 01/26/22 0811  BP: 108/76 101/68 120/69 113/68  Pulse: 85 96 72 99  Resp: 18 18 20 18   Temp: 97.8 F (36.6 C) 98.4 F (36.9 C) 98.3 F (36.8 C) 98.3 F (36.8 C)  TempSrc: Oral Oral Oral Oral  SpO2: 100%  99% 100%  Weight:      Height:       General: alert, cooperative, and no distress Lochia: appropriate Uterine Fundus: firm Incision: N/A DVT Evaluation: No evidence of DVT seen on physical exam.  Labs: Lab  Results  Component Value Date   WBC 17.4 (H) 01/25/2022   HGB 9.7 (L) 01/25/2022   HCT 29.1 (L) 01/25/2022   MCV 89.0 01/25/2022   PLT 152 01/25/2022     Discharge instruction: per After Visit Summary.  Medications:  Allergies as of 01/26/2022       Reactions   Other Itching   Alcohol/citrus fruits/dairy/latex        Medication List     STOP taking these medications    albuterol 108 (90 Base) MCG/ACT inhaler Commonly known as: VENTOLIN HFA   Doxylamine-Pyridoxine 10-10 MG Tbec   famotidine 20 MG tablet Commonly known as: PEPCID   hydrOXYzine 25 MG tablet Commonly known as: ATARAX   metroNIDAZOLE 500 MG tablet Commonly known as: FLAGYL   mirtazapine 15 MG tablet Commonly known as: Remeron       TAKE these medications    escitalopram 10 MG tablet Commonly known as: Lexapro Take 1 tablet (10 mg total) by mouth daily.  Diet: routine diet  Activity: Advance as tolerated. Pelvic rest for 6 weeks.   Outpatient follow up:  Follow-up Information     Rubie Maid, MD. Schedule an appointment as soon as possible for a visit.   Specialties: Obstetrics and Gynecology, Radiology Why: 2 week telephone (or in office if needed and 6 week in office postpartum visits Contact information: [place] [address] [phone]                   Postpartum contraception: Abstinence Rhogam Given postpartum: Rh positive Rubella vaccine given postpartum: immune Varicella vaccine given postpartum: no TDaP given antepartum or postpartum: given antepartum    Newborn Delivery   Birth date/time: 01/24/2022 11:38:00 Delivery type: Vaginal, Spontaneous       Baby Feeding:  Formula  Disposition:home with mother  SIGNED:  Rod Can, CNM 01/26/2022 12:06 PM

## 2022-01-26 NOTE — Progress Notes (Signed)
Patient discharged home with family.  Discharge instructions, when to follow up, and prescriptions reviewed with patient.  Patient verbalized understanding. Patient will be escorted out by auxiliary.   

## 2022-01-26 NOTE — Telephone Encounter (Signed)
"  Call couldn't be completed as dial."

## 2022-01-26 NOTE — Discharge Instructions (Signed)

## 2022-02-04 NOTE — Progress Notes (Deleted)
   Virtual Visit via Video Note  I connected with NAME@ on 02/04/22 at   4:15 PM EST by a video enabled telemedicine application and verified that I am speaking with the correct person using two identifiers.  Location: Patient: *** Provider: OFFICE   I discussed the limitations of evaluation and management by telemedicine and the availability of in person appointments. The patient expressed understanding and agreed to proceed.    History of Present Illness:   Laura Shaffer is a 32 y.o. G79P1011 female who presents for a 2 week televisit for mood check. She is 2 weeks postpartum following a spontaneous vaginal delivery.  The delivery was at 38.2 gestational weeks.  Postpartum course has been well so far. Baby is feeding by ***. Bleeding: ***. Postpartum depression screening: {Desc; negative/positive:13464}.  EDPS score is ***.      The following portions of the patient's history were reviewed and updated as appropriate: allergies, current medications, past family history, past medical history, past social history, past surgical history, and problem list.   Observations/Objective:   Last menstrual period 05/01/2021, unknown if currently breastfeeding. Gen App: NAD Psych: normal speech, affect. Good mood.        01/25/2022    3:45 AM  Edinburgh Postnatal Depression Scale Screening Tool  I have been able to laugh and see the funny side of things. 0  I have looked forward with enjoyment to things. 0  I have blamed myself unnecessarily when things went wrong. 2  I have been anxious or worried for no good reason. 2  I have felt scared or panicky for no good reason. 1  Things have been getting on top of me. 1  I have been so unhappy that I have had difficulty sleeping. 2  I have felt sad or miserable. 2  I have been so unhappy that I have been crying. 1  The thought of harming myself has occurred to me. 0  Edinburgh Postnatal Depression Scale Total 11         Assessment and  Plan:   1. Encounter for screening for maternal depression - Screening {FINDINGS; POSITIVE NEGATIVE:[id number]} today. Will rescreen at 6 week postpartum visit. Overall doing well.    2. Postpartum state - Overall doing well. Continue routine postpartum home care.    3. Lactating mother -    Follow Up Instructions:     I discussed the assessment and treatment plan with the patient. The patient was provided an opportunity to ask questions and all were answered. The patient agreed with the plan and demonstrated an understanding of the instructions.   The patient was advised to call back or seek an in-person evaluation if the symptoms worsen or if the condition fails to improve as anticipated.   I provided *** minutes of non-face-to-face time during this encounter.     Rubie Maid, MD [facility]

## 2022-02-08 ENCOUNTER — Telehealth: Payer: Medicaid Other | Admitting: Obstetrics and Gynecology

## 2022-02-14 NOTE — Progress Notes (Unsigned)
   Virtual Visit via Video Note  I connected with NAME@ on 02/14/22 at  11:15 AM EST by a video enabled telemedicine application and verified that I am speaking with the correct person using two identifiers.  Location: Patient: *** Provider: OFFICE   I discussed the limitations of evaluation and management by telemedicine and the availability of in person appointments. The patient expressed understanding and agreed to proceed.    History of Present Illness:   Laura Shaffer is a 32 y.o. G61P1011 female who presents for a 2 week televisit for mood check. She is 2 weeks postpartum following a spontaneous vaginal.  The delivery was at 38.2 gestational weeks.  Postpartum course has been well so far. Baby is feeding by ***. Bleeding: ***. Postpartum depression screening: {Desc; negative/positive:13464}.  EDPS score is ***.      The following portions of the patient's history were reviewed and updated as appropriate: allergies, current medications, past family history, past medical history, past social history, past surgical history, and problem list.   Observations/Objective:   Last menstrual period 05/01/2021, unknown if currently breastfeeding. Gen App: NAD Psych: normal speech, affect. Good mood.        01/25/2022    3:45 AM  Edinburgh Postnatal Depression Scale Screening Tool  I have been able to laugh and see the funny side of things. 0  I have looked forward with enjoyment to things. 0  I have blamed myself unnecessarily when things went wrong. 2  I have been anxious or worried for no good reason. 2  I have felt scared or panicky for no good reason. 1  Things have been getting on top of me. 1  I have been so unhappy that I have had difficulty sleeping. 2  I have felt sad or miserable. 2  I have been so unhappy that I have been crying. 1  The thought of harming myself has occurred to me. 0  Edinburgh Postnatal Depression Scale Total 11         Assessment and Plan:   1.  Encounter for screening for maternal depression - Screening {FINDINGS; POSITIVE NEGATIVE:[phone]} today. Will rescreen at 6 week postpartum visit. Overall doing well.    2. Postpartum state - Overall doing well. Continue routine postpartum home care.    3. Lactating mother -    Follow Up Instructions:     I discussed the assessment and treatment plan with the patient. The patient was provided an opportunity to ask questions and all were answered. The patient agreed with the plan and demonstrated an understanding of the instructions.   The patient was advised to call back or seek an in-person evaluation if the symptoms worsen or if the condition fails to improve as anticipated.   I provided *** minutes of non-face-to-face time during this encounter.     Rubie Maid, MD [facility]

## 2022-02-15 ENCOUNTER — Ambulatory Visit (INDEPENDENT_AMBULATORY_CARE_PROVIDER_SITE_OTHER): Payer: Medicaid Other | Admitting: Obstetrics and Gynecology

## 2022-02-15 ENCOUNTER — Encounter: Payer: Self-pay | Admitting: Obstetrics and Gynecology

## 2022-02-15 VITALS — Ht 64.0 in

## 2022-02-15 DIAGNOSIS — Z1332 Encounter for screening for maternal depression: Secondary | ICD-10-CM

## 2022-02-15 DIAGNOSIS — J4520 Mild intermittent asthma, uncomplicated: Secondary | ICD-10-CM

## 2022-02-15 DIAGNOSIS — F319 Bipolar disorder, unspecified: Secondary | ICD-10-CM

## 2022-02-15 MED ORDER — ARIPIPRAZOLE 5 MG PO TABS: 5.0000 mg | ORAL_TABLET | Freq: Every day | ORAL | 1 refills | Status: AC

## 2022-02-15 MED ORDER — ALBUTEROL SULFATE HFA 108 (90 BASE) MCG/ACT IN AERS: 2.0000 | INHALATION_SPRAY | Freq: Four times a day (QID) | RESPIRATORY_TRACT | 2 refills | Status: AC | PRN

## 2022-02-15 NOTE — Patient Instructions (Signed)

## 2022-03-08 ENCOUNTER — Ambulatory Visit: Payer: Medicaid Other | Admitting: Obstetrics and Gynecology

## 2022-03-15 ENCOUNTER — Telehealth: Payer: Self-pay

## 2022-03-15 NOTE — Telephone Encounter (Signed)
[facility]- Discharge Call Backs 1-Do you have any questions or concerns about yourself as you heal?Yes. Pt c/o vaginal discharge that is smelly.  Encouraged pt to call OBGYN for follow-up. Pt has an appt already scheduled there this week. 2-Any concerns or questions about your baby?No Is your baby eating, peeing,pooping well?Yes 3-Where does your baby sleep in your home? Bassinet Review ABC's of safe sleep. 4-How was your stay at the hospital?Great 5-How did our team work together to care for you?Pt said she already sent survey back in. We would really appreciate it if you could fill that out for [place] and return it in the mail.  We value the feedback to make improvements and continue the great work we do.   If you have any questions please feel free to call me back at [phone]

## 2022-03-17 ENCOUNTER — Ambulatory Visit: Payer: Medicaid Other | Admitting: Obstetrics and Gynecology

## 2022-03-17 NOTE — Progress Notes (Deleted)
   OBSTETRICS POSTPARTUM CLINIC PROGRESS NOTE  Subjective:     Satin A Haberle is a 32 y.o. G57P1011 female who presents for a postpartum visit. She is {1-10:13787} {time; units:18646} postpartum following a {delivery:12449}. I have fully reviewed the prenatal and intrapartum course. The delivery was at *** gestational weeks.  Anesthesia: {anesthesia types:812}. Postpartum course has been ***. Baby's course has been ***. Baby is feeding by {breast/bottle:69}. Bleeding: patient {HAS HAS CG:[id number] not resumed menses, with Patient's last menstrual period was 05/01/2021.Marland Kitchen Bowel function is {normal:32111}. Bladder function is {normal:32111}. Patient {is/is not:9024} sexually active. Contraception method desired is {contraceptive method:5051}. Postpartum depression screening: {neg default:13464::"negative"}.  EDPS score is ***.    The following portions of the patient's history were reviewed and updated as appropriate: allergies, current medications, past family history, past medical history, past social history, past surgical history, and problem list.  Review of Systems {ros; complete:30496}   Objective:    LMP 05/01/2021   General:  alert and no distress   Breasts:  inspection negative, no nipple discharge or bleeding, no masses or nodularity palpable  Lungs: clear to auscultation bilaterally  Heart:  regular rate and rhythm, S1, S2 normal, no murmur, click, rub or gallop  Abdomen: soft, non-tender; bowel sounds normal; no masses,  no organomegaly.  ***Well healed Pfannenstiel incision   Vulva:  normal  Vagina: normal vagina, no discharge, exudate, lesion, or erythema  Cervix:  no cervical motion tenderness and no lesions  Corpus: normal size, contour, position, consistency, mobility, non-tender  Adnexa:  normal adnexa and no mass, fullness, tenderness  Rectal Exam: Not performed.         Labs:  Lab Results  Component Value Date   HGB 9.7 (L) 01/25/2022     Assessment:   No  diagnosis found.   Plan:    1. Contraception: {method:5051} 2. Will check Hgb for h/o postpartum anemia of less than 10.  3. Follow up in: {1-10:13787} {time; units:19136} or as needed.    Chilton Greathouse, [facility]

## 2022-03-22 NOTE — Progress Notes (Unsigned)
   OBSTETRICS POSTPARTUM CLINIC PROGRESS NOTE  Subjective:     Laura Shaffer is a 32 y.o. G75P1011 female who presents for a postpartum visit. She is {1-10:13787} {time; units:18646} postpartum following a spontaneous vaginal delivery. I have fully reviewed the prenatal and intrapartum course. The delivery was at 73 gestational weeks.  Anesthesia: {anesthesia types:812}. Postpartum course has been ***. Baby's course has been ***. Baby is feeding by {breast/bottle:69}. Bleeding: patient {HAS HAS CG:[id number] not resumed menses, with Patient's last menstrual period was 05/01/2021.Marland Kitchen Bowel function is {normal:32111}. Bladder function is {normal:32111}. Patient {is/is not:9024} sexually active. Contraception method desired is {contraceptive method:5051}. Postpartum depression screening: {neg default:13464::"negative"}.  EDPS score is ***.    The following portions of the patient's history were reviewed and updated as appropriate: allergies, current medications, past family history, past medical history, past social history, past surgical history, and problem list.  Review of Systems {ros; complete:30496}   Objective:    LMP 05/01/2021   General:  alert and no distress   Breasts:  inspection negative, no nipple discharge or bleeding, no masses or nodularity palpable  Lungs: clear to auscultation bilaterally  Heart:  regular rate and rhythm, S1, S2 normal, no murmur, click, rub or gallop  Abdomen: soft, non-tender; bowel sounds normal; no masses,  no organomegaly.  ***Well healed Pfannenstiel incision   Vulva:  normal  Vagina: normal vagina, no discharge, exudate, lesion, or erythema  Cervix:  no cervical motion tenderness and no lesions  Corpus: normal size, contour, position, consistency, mobility, non-tender  Adnexa:  normal adnexa and no mass, fullness, tenderness  Rectal Exam: Not performed.         Labs:  Lab Results  Component Value Date   HGB 9.7 (L) 01/25/2022     Assessment:    No diagnosis found.   Plan:    1. Contraception: {method:5051} 2. Will check Hgb for h/o postpartum anemia of less than 10.  3. Follow up in: {1-10:13787} {time; units:19136} or as needed.    Landis Gandy, [facility]

## 2022-03-23 ENCOUNTER — Telehealth: Payer: Self-pay

## 2022-03-23 ENCOUNTER — Encounter: Payer: Self-pay | Admitting: Obstetrics and Gynecology

## 2022-03-23 ENCOUNTER — Ambulatory Visit (INDEPENDENT_AMBULATORY_CARE_PROVIDER_SITE_OTHER): Payer: Medicaid Other | Admitting: Obstetrics and Gynecology

## 2022-03-23 DIAGNOSIS — O9081 Anemia of the puerperium: Secondary | ICD-10-CM

## 2022-03-23 DIAGNOSIS — F319 Bipolar disorder, unspecified: Secondary | ICD-10-CM

## 2022-03-23 MED ORDER — HYDROXYZINE HCL 25 MG PO TABS: 25.0000 mg | ORAL_TABLET | Freq: Four times a day (QID) | ORAL | 2 refills | Status: AC | PRN

## 2022-03-23 MED ORDER — VITAFOL FE+ 90-0.6-0.4-200 MG PO CAPS: 1.0000 | ORAL_CAPSULE | Freq: Every day | ORAL | 3 refills | Status: AC

## 2022-03-23 NOTE — Telephone Encounter (Signed)
Had appt 03/25/22 for varicella vaccine after negative varicella titer obtained during prenatal care.  Per [facility] record, pt has documentation of 2 varicella vaccines.  I consulted with Dr. Lubertha Sayres and Amy Widderich RN consulted with state immunization branch and both recommended no varicella booster.  Pt notified.  Tonny Branch, RN

## 2022-03-24 ENCOUNTER — Ambulatory Visit: Payer: Medicaid Other

## 2022-03-25 ENCOUNTER — Ambulatory Visit: Payer: Medicaid Other

## 2022-04-25 NOTE — Progress Notes (Unsigned)
Virtual Visit via Video Note  I connected with Laura Shaffer on 04/26/22 at  3:55 PM EDT by a video enabled telemedicine application and verified that I am speaking with the correct person using two identifiers.  Location: Patient: Home Provider: Office   I discussed the limitations of evaluation and management by telemedicine and the availability of in person appointments. The patient expressed understanding and agreed to proceed.   Subjective:    Patient ID: Laura Shaffer, female    DOB: 1990/10/16, 32 y.o.   MRN: [id number]  HPI  Patient is a 32 y.o. G49P1011 female who presents for 1 month follow up on mood check. She has history of bipolar disorder. She is ~ 3 months postpartum. She was taking Abilify only prn. Last month she was prescribed Hydroxyzine to take prn and she was also referred to counseling. Today she reports that she continues to take the Hydroxyzine as prescribed and it has helped. She did not start seeing a counselor as she was referred to one, but notes that she is "not quite there yet" to start counseling. Her Edinburgh score is positive at 11.  Is annoyed with her skin.  Is having some skin issues with peeling and picking at her face. Thinks it may be stress related.   The following portions of the patient's history were reviewed and updated as appropriate: allergies, current medications, past family history, past medical history, past social history, past surgical history, and problem list.  Review of Systems Pertinent items noted in HPI and remainder of comprehensive ROS otherwise negative.   Objective:   Height 5\' 5"  (1.651 m), weight 112 lb (50.8 kg), not currently breastfeeding.  General appearance: alert, cooperative, and no distress Psychiatric: Normal speech, normal affect, mildly anxious    Edinburgh Postnatal Depression Scale - 04/26/22 1600       Edinburgh Postnatal Depression Scale:  In the Past 7 Days   I have been able to laugh and see  the funny side of things. 0    I have looked forward with enjoyment to things. 0    I have blamed myself unnecessarily when things went wrong. 2    I have been anxious or worried for no good reason. 2    I have felt scared or panicky for no good reason. 2    Things have been getting on top of me. 1    I have been so unhappy that I have had difficulty sleeping. 1    I have felt sad or miserable. 2    I have been so unhappy that I have been crying. 1    The thought of harming myself has occurred to me. 0    Edinburgh Postnatal Depression Scale Total 11              Assessment:   1. Bipolar 1 disorder (HCC) 2012      Plan:  - Discussed with patient regarding options again for considering counseling in addition to Vistaril. Patient still hesitant at this time.  Also discussed natural remedies for improving mood including SAM-E, as patient notes she wasn't to continue a more natural approach to management. Advised that once symptoms are managed, she will notice less picking of her face and less skin issues. Patient notes understanding.  - To f/u in 1 month.      I discussed the assessment and treatment plan with the patient. The patient was provided an opportunity to ask questions and all  were answered. The patient agreed with the plan and demonstrated an understanding of the instructions.   The patient was advised to call back or seek an in-person evaluation if the symptoms worsen or if the condition fails to improve as anticipated.  I provided 9 minutes of non-face-to-face time during this encounter.   Hildred Laser, MD [facility]

## 2022-04-26 ENCOUNTER — Encounter: Payer: Self-pay | Admitting: Obstetrics and Gynecology

## 2022-04-26 ENCOUNTER — Telehealth: Payer: Medicaid Other | Admitting: Obstetrics and Gynecology

## 2022-04-26 VITALS — Ht 65.0 in | Wt 112.0 lb

## 2022-04-26 DIAGNOSIS — F319 Bipolar disorder, unspecified: Secondary | ICD-10-CM

## 2022-04-27 ENCOUNTER — Telehealth: Payer: Self-pay | Admitting: Obstetrics and Gynecology

## 2022-04-27 NOTE — Telephone Encounter (Signed)
Patient needs f/u in 1 month for mood check, can be televisit or in person with Dr. Valentino Saxon. I contacted the patient via phone. I left message for the patient to call back to be scheduled

## 2022-04-28 NOTE — Telephone Encounter (Signed)
I contacted the patient via phone. I left message for the patient to call back to be scheduled. 

## 2022-04-29 NOTE — Telephone Encounter (Signed)
I contacted patient via phone. I left voicemail for patient to call back to be scheduled.   

## 2022-04-29 NOTE — Telephone Encounter (Signed)
The patient called and scheduled for 5/14 with [facility] for My chart video appointment.

## 2022-05-31 ENCOUNTER — Encounter: Payer: Self-pay | Admitting: Obstetrics and Gynecology

## 2022-05-31 ENCOUNTER — Telehealth: Payer: Medicaid Other | Admitting: Obstetrics and Gynecology

## 2022-05-31 DIAGNOSIS — F319 Bipolar disorder, unspecified: Secondary | ICD-10-CM

## 2022-05-31 NOTE — Progress Notes (Signed)
Virtual Visit via Telephone Note  I connected with Laura Shaffer on 05/31/22 at  3:55 PM EDT by a telephone enabled telemedicine application and verified that I am speaking with the correct person using two identifiers.  Location: Patient: [employer]cery store (was initially scheduled for video visit but preferred telephone as she was in the store) Provider: Office   I discussed the limitations of evaluation and management by telemedicine and the availability of in person appointments. The patient expressed understanding and agreed to proceed.   Subjective:    Patient ID: Laura Shaffer, female    DOB: 1990-01-22, 32 y.o.   MRN: [id number]  HPI  Patient is a 32 y.o. G47P1011 female who presents for 1 month follow up on mood check. She has history of bipolar disorder. She is ~ 4 months postpartum.  Is currently on Hydroxyzine takes prn and she was also referred to counseling. Is not taking Abilify as she notes it was making her drowsy in the day time, was previously only taking prn.   Her Edinburgh score is positive at 11 again today, however patient overall does feel like she is doing better. Notes that her mood and anxiety have improved. Has questions as to if anyone was going to contact her to help her with finding resources and a job.    The following portions of the patient's history were reviewed and updated as appropriate: allergies, current medications, past family history, past medical history, past social history, past surgical history, and problem list.  Review of Systems Pertinent items noted in HPI and remainder of comprehensive ROS otherwise negative.   Objective:   not currently breastfeeding.   Wt Readings from Last 3 Encounters:  04/26/22 112 lb (50.8 kg)  03/23/22 106 lb (48.1 kg)  01/24/22 115 lb (52.2 kg)   Ht Readings from Last 3 Encounters:  04/26/22 5\' 5"  (1.651 m)  03/23/22 5\' 5"  (1.651 m)  02/15/22 5\' 4"  (1.626 m)    General appearance: alert,  cooperative, and no distress Psychiatric: Normal speech, normal affect     05/31/2022    4:46 PM 04/26/2022    4:00 PM 03/23/2022   11:21 AM 02/15/2022   11:05 AM 01/25/2022    3:45 AM  Edinburgh Postnatal Depression Scale Screening Tool  I have been able to laugh and see the funny side of things. 0 0 0 0 0  I have looked forward with enjoyment to things. 0 0 0 0 0  I have blamed myself unnecessarily when things went wrong. 1 2 2 3 2   I have been anxious or worried for no good reason. 2 2 2 2 2   I have felt scared or panicky for no good reason. 2 2 2 2 1   Things have been getting on top of me. 2 1 2 2 1   I have been so unhappy that I have had difficulty sleeping. 1 1 3 2 2   I have felt sad or miserable. 2 2 3 2 2   I have been so unhappy that I have been crying. 1 1 3 1 1   The thought of harming myself has occurred to me. 0 0 0 0 0  Edinburgh Postnatal Depression Scale Total 11 11 17 14 11         Assessment:   1. Bipolar 1 disorder (HCC) 2012      Plan:  - Discussed addition of Abilify at night if she is concerned about drowsiness.  Patient notes that she does  truly desire to utilize this medication. Can also continue use of the Hydroxyzine for her anxiety as needed.  - Will reach out to social worker for assistance in additional resources (patient relocated to this area within weeks of delivering her baby, does not have a strong support system).  -  Can follow up in 2-3 months for further mood assessments if desired.     I discussed the assessment and treatment plan with the patient. The patient was provided an opportunity to ask questions and all were answered. The patient agreed with the plan and demonstrated an understanding of the instructions.   The patient was advised to call back or seek an in-person evaluation if the symptoms worsen or if the condition fails to improve as anticipated.  I provided 11 minutes of non-face-to-face time during this encounter.   Hildred Laser,  MD [facility]

## 2022-06-01 ENCOUNTER — Encounter: Payer: Self-pay | Admitting: Obstetrics and Gynecology

## 2022-07-06 ENCOUNTER — Encounter: Payer: Self-pay | Admitting: Obstetrics and Gynecology

## 2022-08-18 DIAGNOSIS — Z419 Encounter for procedure for purposes other than remedying health state, unspecified: Secondary | ICD-10-CM | POA: Diagnosis not present

## 2022-09-18 DIAGNOSIS — Z419 Encounter for procedure for purposes other than remedying health state, unspecified: Secondary | ICD-10-CM | POA: Diagnosis not present

## 2022-10-18 DIAGNOSIS — Z419 Encounter for procedure for purposes other than remedying health state, unspecified: Secondary | ICD-10-CM | POA: Diagnosis not present

## 2022-11-18 DIAGNOSIS — Z419 Encounter for procedure for purposes other than remedying health state, unspecified: Secondary | ICD-10-CM | POA: Diagnosis not present

## 2022-11-25 ENCOUNTER — Other Ambulatory Visit (HOSPITAL_COMMUNITY)
Admission: RE | Admit: 2022-11-25 | Discharge: 2022-11-25 | Disposition: A | Discharge status: Home / Self Care | Payer: Medicaid Other | Source: Ambulatory Visit | Attending: Obstetrics and Gynecology | Admitting: Obstetrics and Gynecology

## 2022-11-25 ENCOUNTER — Encounter: Payer: Self-pay | Admitting: Obstetrics and Gynecology

## 2022-11-25 ENCOUNTER — Ambulatory Visit (INDEPENDENT_AMBULATORY_CARE_PROVIDER_SITE_OTHER): Payer: Medicaid Other | Admitting: Obstetrics and Gynecology

## 2022-11-25 VITALS — BP 113/65 | HR 103 | Ht 65.0 in | Wt 111.4 lb

## 2022-11-25 DIAGNOSIS — Z23 Encounter for immunization: Secondary | ICD-10-CM | POA: Diagnosis not present

## 2022-11-25 DIAGNOSIS — Z01411 Encounter for gynecological examination (general) (routine) with abnormal findings: Secondary | ICD-10-CM | POA: Diagnosis not present

## 2022-11-25 DIAGNOSIS — R87610 Atypical squamous cells of undetermined significance on cytologic smear of cervix (ASC-US): Secondary | ICD-10-CM | POA: Diagnosis not present

## 2022-11-25 DIAGNOSIS — Z01419 Encounter for gynecological examination (general) (routine) without abnormal findings: Secondary | ICD-10-CM | POA: Diagnosis not present

## 2022-11-25 DIAGNOSIS — Z124 Encounter for screening for malignant neoplasm of cervix: Secondary | ICD-10-CM

## 2022-11-25 DIAGNOSIS — Z113 Encounter for screening for infections with a predominantly sexual mode of transmission: Secondary | ICD-10-CM

## 2022-11-25 DIAGNOSIS — Z7689 Persons encountering health services in other specified circumstances: Secondary | ICD-10-CM | POA: Diagnosis not present

## 2022-11-25 NOTE — Progress Notes (Signed)
GYNECOLOGY ANNUAL PHYSICAL EXAM PROGRESS NOTE  Subjective:    Laura Shaffer is a 32 y.o. G45P1011 female who presents for an annual exam.  The patient is not currently sexually active. The patient participates in regular exercise: yes. Has the patient ever been transfused or tattooed?: yes (professional tattoo). The patient reports that there is not domestic violence in her life.   The patient has the following complaints today: Notes that she has relocated to [facility] to live with her grandmother temporarily, to help get back on her feet.  Notes that se is not taking her meds for anxiety and depression, states that she was inconsistent with use, and the act of taking medications was creating more anxiety. She also did not follow up with a therapist as recommended. Notes she is just trying to manage on her own.    Menstrual History: Menarche age: 21 Patient's last menstrual period was 11/01/2022. Period Duration (Days): 3-4 Period Pattern: Regular Menstrual Flow: Moderate Menstrual Control: Maxi pad, Thin pad Menstrual Control Change Freq (Hours): 2-3 Dysmenorrhea: (!) Severe Dysmenorrhea Symptoms: Cramping   Gynecologic History:  Contraception: none History of STI's: Chlamydia and trichomonas   Last Pap: 01/31/20. Results were: normal.  Denies h/o abnormal pap smears. Last mammogram: n/a due to age.    Upstream - 11/25/22 1357       Pregnancy Intention Screening   Does the patient want to become pregnant in the next year? No    Does the patient's partner want to become pregnant in the next year? N/A    Would the patient like to discuss contraceptive options today? No      Contraception Wrap Up   Current Method Abstinence    End Method Abstinence    Contraception Counseling Provided No    How was the end contraceptive method provided? N/A               OB History  Gravida Para Term Preterm AB Living  2 1 1  0 1 1  SAB IAB Ectopic Multiple Live Births  1  0 0 0 1    # Outcome Date GA Lbr Len/2nd Weight Sex Type Anes PTL Lv  2 Term 01/24/22 [redacted]w[redacted]d / 00:32 5 lb 8.5 oz (2.51 kg)  Vag-Spont None  LIV     Name: Dory,PENDINGBABY     Apgar1: 8  Apgar5: 10  1 SAB             Past Medical History:  Diagnosis Date   Asthma    Mental disorder     History reviewed. No pertinent surgical history.  Family History  Problem Relation Age of Onset   Diabetes Maternal Uncle    Hypertension Maternal Uncle    Hypertension Maternal Aunt    Prostate cancer Maternal Uncle    Sickle cell trait Other    HIV Mother    Alcohol abuse Mother     Social History   Socioeconomic History   Marital status: Single    Spouse name: Not on file   Number of children: Not on file   Years of education: Not on file   Highest education level: Not on file  Occupational History   Not on file  Tobacco Use   Smoking status: Every Day    Types: Cigars   Smokeless tobacco: Never  Vaping Use   Vaping status: Never Used  Substance and Sexual Activity   Alcohol use: Yes    Alcohol/week: 1.0 standard drink of  alcohol    Types: 1 Standard drinks or equivalent per week    Comment: occasional   Drug use: Not Currently    Types: Marijuana    Comment: last use 01/18/20   Sexual activity: Not Currently    Partners: Male    Birth control/protection: Condom, Abstinence  Other Topics Concern   Not on file  Social History Narrative   Not on file   Social Determinants of Health   Financial Resource Strain: Not on file  Food Insecurity: No Food Insecurity (01/24/2022)   Hunger Vital Sign    Worried About Running Out of Food in the Last Year: Never true    Ran Out of Food in the Last Year: Never true  Transportation Needs: No Transportation Needs (01/24/2022)   PRAPARE - Administrator, Civil Service (Medical): No    Lack of Transportation (Non-Medical): No  Physical Activity: Not on file  Stress: Not on file  Social Connections: Not on file  Intimate  Partner Violence: Not At Risk (01/24/2022)   Humiliation, Afraid, Rape, and Kick questionnaire    Fear of Current or Ex-Partner: No    Emotionally Abused: No    Physically Abused: No    Sexually Abused: No    Current Outpatient Medications on File Prior to Visit  Medication Sig Dispense Refill   albuterol (VENTOLIN HFA) 108 (90 Base) MCG/ACT inhaler Inhale 2 puffs into the lungs every 6 (six) hours as needed for wheezing or shortness of breath. 8 g 2   ARIPiprazole (ABILIFY) 5 MG tablet Take 1 tablet (5 mg total) by mouth daily. 90 tablet 1   Prenat-Fe Poly-Methfol-FA-DHA (VITAFOL FE+) 90-0.6-0.4-200 MG CAPS Take 1 tablet by mouth daily. 90 capsule 3   escitalopram (LEXAPRO) 10 MG tablet Take 1 tablet (10 mg total) by mouth daily. 30 tablet 3   hydrOXYzine (ATARAX) 25 MG tablet Take 1 tablet (25 mg total) by mouth every 6 (six) hours as needed for anxiety. (Patient not taking: Reported on 11/25/2022) 30 tablet 2   No current facility-administered medications on file prior to visit.    Allergies  Allergen Reactions   Other Itching    Alcohol/citrus fruits/dairy/latex     Review of Systems Constitutional: negative for chills, fatigue, fevers and sweats Eyes: negative for irritation, redness and visual disturbance Ears, nose, mouth, throat, and face: negative for hearing loss, nasal congestion, snoring and tinnitus Respiratory: negative for asthma, cough, sputum Cardiovascular: negative for chest pain, dyspnea, exertional chest pressure/discomfort, irregular heart beat, palpitations and syncope Gastrointestinal: negative for abdominal pain, change in bowel habits, nausea and vomiting Genitourinary: negative for abnormal menstrual periods, genital lesions, sexual problems and vaginal discharge, dysuria and urinary incontinence Integument/breast: negative for breast lump, breast tenderness and nipple discharge Hematologic/lymphatic: negative for bleeding and easy  bruising Musculoskeletal:negative for back pain and muscle weakness Neurological: negative for dizziness, headaches, vertigo and weakness Endocrine: negative for diabetic symptoms including polydipsia, polyuria and skin dryness Allergic/Immunologic: negative for hay fever and urticaria      Objective:  Blood pressure 113/65, pulse (!) 103, height 5\' 5"  (1.651 m), weight 111 lb 6.4 oz (50.5 kg), last menstrual period 11/01/2022, not currently breastfeeding. Body mass index is 18.54 kg/m.  General Appearance:    Alert, cooperative, no distress, appears stated age  Head:    Normocephalic, without obvious abnormality, atraumatic  Eyes:    PERRL, conjunctiva/corneas clear, EOM's intact, both eyes  Ears:    Normal external ear canals, both ears  Nose:   Nares normal, septum midline, mucosa normal, no drainage or sinus tenderness  Throat:   Lips, mucosa, and tongue normal; teeth and gums normal  Neck:   Supple, symmetrical, trachea midline, no adenopathy; thyroid: no enlargement/tenderness/nodules; no carotid bruit or JVD  Back:     Symmetric, no curvature, ROM normal, no CVA tenderness  Lungs:     Clear to auscultation bilaterally, respirations unlabored  Chest Wall:    No tenderness or deformity   Heart:    Regular rate and rhythm, S1 and S2 normal, no murmur, rub or gallop  Breast Exam:    No tenderness, masses, or nipple abnormality  Abdomen:     Soft, non-tender, bowel sounds active all four quadrants, no masses, no organomegaly.    Genitalia:    Pelvic:external genitalia normal, vagina without lesions, discharge, or tenderness, rectovaginal septum  normal. Cervix normal in appearance, no cervical motion tenderness, no adnexal masses or tenderness.  Uterus normal size, shape, mobile, regular contours, nontender.  Rectal:    Normal external sphincter.  No hemorrhoids appreciated. Internal exam not done.   Extremities:   Extremities normal, atraumatic, no cyanosis or edema  Pulses:   2+ and  symmetric all extremities  Skin:   Skin color, texture, turgor normal, no rashes or lesions  Lymph nodes:   Cervical, supraclavicular, and axillary nodes normal  Neurologic:   CNII-XII intact, normal strength, sensation and reflexes throughout      11/25/2022    2:37 PM  GAD 7 : Generalized Anxiety Score  Nervous, Anxious, on Edge 1  Control/stop worrying 0  Worry too much - different things 0  Trouble relaxing 1  Restless 0  Easily annoyed or irritable 3  Afraid - awful might happen 0  Total GAD 7 Score 5        11/25/2022    2:37 PM 01/31/2020    9:14 AM 12/11/2019   10:01 AM  Depression screen PHQ 2/9  Decreased Interest 0 3 3  Down, Depressed, Hopeless 1 3 3   PHQ - 2 Score 1 6 6   Altered sleeping 1  3  Tired, decreased energy 1  3  Change in appetite 0  3  Feeling bad or failure about yourself  0  3  Trouble concentrating 0  3  Moving slowly or fidgety/restless 0  3  Suicidal thoughts 0  3  PHQ-9 Score 3  27  Difficult doing work/chores   Very difficult     Labs:  Lab Results  Component Value Date   WBC 17.4 (H) 01/25/2022   HGB 9.7 (L) 01/25/2022   HCT 29.1 (L) 01/25/2022   MCV 89.0 01/25/2022   PLT 152 01/25/2022    Lab Results  Component Value Date   CREATININE 0.98 10/17/2019   BUN 12 10/17/2019   NA 137 10/17/2019   K 3.7 10/17/2019   CL 104 10/17/2019   CO2 24 10/17/2019    Lab Results  Component Value Date   ALT 12 10/17/2019   AST 14 (L) 10/17/2019   ALKPHOS 39 10/17/2019   BILITOT 0.4 10/17/2019    Lab Results  Component Value Date   TSH 2.279 11/09/2018     Assessment:   1. Well woman exam with routine gynecological exam   2. Cervical cancer screening   3. Screening for STD (sexually transmitted disease)   4. Need for Tdap vaccination      Plan:  - Blood tests: see orders. - Breast self exam technique  reviewed and patient encouraged to perform self-exam monthly. - Contraception: abstinence. - Discussed healthy lifestyle  modifications. - Pap smear ordered (due in January but patient notes she does not have reliable transportation so would prefer to perform today). - Flu vaccine: Declined - Tdap: given today.  - Anxiety and depression, GAD and PHQ-9 with only borderline elevations today, self-managing.  - Desires STI screening, although reports being abstinent at this time. Performed per request.  - Follow up in 1 year for annual exam   Hildred Laser, MD [facility]

## 2022-11-26 LAB — HIV ANTIBODY (ROUTINE TESTING W REFLEX): HIV Screen 4th Generation wRfx: NONREACTIVE

## 2022-11-26 LAB — RPR: RPR Ser Ql: NONREACTIVE

## 2022-11-26 LAB — HEPATITIS B SURFACE ANTIGEN: Hepatitis B Surface Ag: NEGATIVE

## 2022-11-26 LAB — HEPATITIS C ANTIBODY: Hep C Virus Ab: NONREACTIVE

## 2022-11-29 ENCOUNTER — Encounter: Payer: Self-pay | Admitting: Obstetrics and Gynecology

## 2022-11-29 ENCOUNTER — Other Ambulatory Visit: Payer: Self-pay | Admitting: Obstetrics and Gynecology

## 2022-11-29 DIAGNOSIS — N760 Acute vaginitis: Secondary | ICD-10-CM

## 2022-11-29 LAB — CERVICOVAGINAL ANCILLARY ONLY
Bacterial Vaginitis (gardnerella): POSITIVE — AB
Candida Glabrata: NEGATIVE
Candida Vaginitis: NEGATIVE
Chlamydia: NEGATIVE
Comment: NEGATIVE
Comment: NEGATIVE
Comment: NEGATIVE
Comment: NEGATIVE
Comment: NEGATIVE
Comment: NORMAL
Neisseria Gonorrhea: NEGATIVE
Trichomonas: NEGATIVE

## 2022-11-29 MED ORDER — METRONIDAZOLE 500 MG PO TABS: 500.0000 mg | ORAL_TABLET | Freq: Two times a day (BID) | ORAL | 0 refills | Status: AC

## 2022-11-30 LAB — CYTOLOGY - PAP
Chlamydia: NEGATIVE
Comment: NEGATIVE
Comment: NEGATIVE
Comment: NEGATIVE
Comment: NORMAL
Diagnosis: UNDETERMINED — AB
High risk HPV: NEGATIVE
Neisseria Gonorrhea: NEGATIVE
Trichomonas: NEGATIVE

## 2022-12-07 DIAGNOSIS — Z7689 Persons encountering health services in other specified circumstances: Secondary | ICD-10-CM | POA: Diagnosis not present

## 2022-12-18 DIAGNOSIS — Z419 Encounter for procedure for purposes other than remedying health state, unspecified: Secondary | ICD-10-CM | POA: Diagnosis not present

## 2022-12-30 ENCOUNTER — Telehealth: Payer: Self-pay

## 2022-12-30 NOTE — Telephone Encounter (Signed)
Pt called stating that we needed to send in a [place] Authorization for medicaid for her therapy. Is this something you would do? Please advise

## 2023-01-18 DIAGNOSIS — Z419 Encounter for procedure for purposes other than remedying health state, unspecified: Secondary | ICD-10-CM | POA: Diagnosis not present

## 2023-02-18 DIAGNOSIS — Z419 Encounter for procedure for purposes other than remedying health state, unspecified: Secondary | ICD-10-CM | POA: Diagnosis not present

## 2023-02-26 ENCOUNTER — Ambulatory Visit (HOSPITAL_COMMUNITY)
Admission: EM | Admit: 2023-02-26 | Discharge: 2023-02-26 | Disposition: A | Discharge status: Home / Self Care | Payer: Medicaid Other | Attending: Internal Medicine | Admitting: Internal Medicine

## 2023-02-26 ENCOUNTER — Encounter (HOSPITAL_COMMUNITY): Payer: Self-pay

## 2023-02-26 DIAGNOSIS — S39012A Strain of muscle, fascia and tendon of lower back, initial encounter: Secondary | ICD-10-CM | POA: Diagnosis not present

## 2023-02-26 MED ORDER — DEXAMETHASONE SODIUM PHOSPHATE 10 MG/ML IJ SOLN
10.0000 mg | Freq: Once | INTRAMUSCULAR | Status: AC
  Administered 2023-02-26: 10 mg via INTRAMUSCULAR

## 2023-02-26 MED ORDER — KETOROLAC TROMETHAMINE 30 MG/ML IJ SOLN
INTRAMUSCULAR | Status: AC
  Filled 2023-02-26: qty 1

## 2023-02-26 MED ORDER — PREDNISONE 20 MG PO TABS: 40.0000 mg | ORAL_TABLET | Freq: Every day | ORAL | 0 refills | Status: AC

## 2023-02-26 MED ORDER — CYCLOBENZAPRINE HCL 5 MG PO TABS: 5.0000 mg | ORAL_TABLET | Freq: Three times a day (TID) | ORAL | 0 refills | Status: AC | PRN

## 2023-02-26 MED ORDER — DEXAMETHASONE SODIUM PHOSPHATE 10 MG/ML IJ SOLN
INTRAMUSCULAR | Status: AC
  Filled 2023-02-26: qty 1

## 2023-02-26 MED ORDER — KETOROLAC TROMETHAMINE 30 MG/ML IJ SOLN
30.0000 mg | Freq: Once | INTRAMUSCULAR | Status: AC
  Administered 2023-02-26: 30 mg via INTRAMUSCULAR

## 2023-02-26 NOTE — Discharge Instructions (Addendum)
 Strain of the muscles in the lumbar region. We will treat with the following:  Toradol  injection given today. This is a medication to help with pain. This is not a narcotic.   Decadron  injection given today. This is a steroid to help with inflammation and pain. Flexeril  5 mg every 8 hours as needed for muscle spasms.  Use caution as this medication can cause drowsiness.  Prednisone  40 mg (2 tablets) once daily for 5 days. Take this in the morning.  This is a steroid to help with inflammation and pain. Start tomorrow 2/10 Light stretching to avoid stiffness May use alternating heat and ice to help with symptoms.  Do not apply directly to the skin.  Do each for 10 to 15 minutes 2-3 times daily Return to urgent care or PCP if symptoms worsen or fail to resolve.

## 2023-02-26 NOTE — ED Triage Notes (Signed)
 Patient reports that she has had lower back pain that radiates into the buttocks since yesterday.  Patient states she has not had any medications for her pain.
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
## 2023-03-03 DIAGNOSIS — Z7689 Persons encountering health services in other specified circumstances: Secondary | ICD-10-CM | POA: Diagnosis not present

## 2023-03-09 DIAGNOSIS — Z7689 Persons encountering health services in other specified circumstances: Secondary | ICD-10-CM | POA: Diagnosis not present

## 2023-03-13 DIAGNOSIS — Z7689 Persons encountering health services in other specified circumstances: Secondary | ICD-10-CM | POA: Diagnosis not present

## 2023-03-18 DIAGNOSIS — Z419 Encounter for procedure for purposes other than remedying health state, unspecified: Secondary | ICD-10-CM | POA: Diagnosis not present

## 2023-03-20 DIAGNOSIS — Z7689 Persons encountering health services in other specified circumstances: Secondary | ICD-10-CM | POA: Diagnosis not present

## 2023-04-04 DIAGNOSIS — Z7689 Persons encountering health services in other specified circumstances: Secondary | ICD-10-CM | POA: Diagnosis not present

## 2023-04-28 ENCOUNTER — Ambulatory Visit: Payer: Self-pay | Admitting: Family Medicine

## 2023-04-29 DIAGNOSIS — Z419 Encounter for procedure for purposes other than remedying health state, unspecified: Secondary | ICD-10-CM | POA: Diagnosis not present

## 2023-05-29 DIAGNOSIS — Z419 Encounter for procedure for purposes other than remedying health state, unspecified: Secondary | ICD-10-CM | POA: Diagnosis not present

## 2023-06-29 DIAGNOSIS — Z419 Encounter for procedure for purposes other than remedying health state, unspecified: Secondary | ICD-10-CM | POA: Diagnosis not present

## 2023-07-12 DIAGNOSIS — Z91040 Latex allergy status: Secondary | ICD-10-CM | POA: Diagnosis not present

## 2023-07-12 DIAGNOSIS — Z8616 Personal history of COVID-19: Secondary | ICD-10-CM | POA: Diagnosis not present

## 2023-07-12 DIAGNOSIS — J45909 Unspecified asthma, uncomplicated: Secondary | ICD-10-CM | POA: Diagnosis not present

## 2023-07-12 DIAGNOSIS — F319 Bipolar disorder, unspecified: Secondary | ICD-10-CM | POA: Diagnosis not present

## 2023-07-12 DIAGNOSIS — F419 Anxiety disorder, unspecified: Secondary | ICD-10-CM | POA: Diagnosis not present

## 2023-07-12 DIAGNOSIS — Z5982 Transportation insecurity: Secondary | ICD-10-CM | POA: Diagnosis not present

## 2023-07-29 DIAGNOSIS — Z419 Encounter for procedure for purposes other than remedying health state, unspecified: Secondary | ICD-10-CM | POA: Diagnosis not present

## 2023-08-17 ENCOUNTER — Telehealth: Payer: Self-pay

## 2023-08-17 DIAGNOSIS — F319 Bipolar disorder, unspecified: Secondary | ICD-10-CM

## 2023-08-25 ENCOUNTER — Telehealth: Payer: Self-pay

## 2023-08-25 NOTE — Progress Notes (Signed)
 Complex Care Management Note Care Guide Note  08/25/2023 Name: ANJELIQUE MAKAR MRN: [id number] DOB: Sep 05, 1990   Complex Care Management Outreach Attempts: An unsuccessful telephone outreach was attempted today to offer the patient information about available complex care management services.  Follow Up Plan:  Additional outreach attempts will be made to offer the patient complex care management information and services.   Encounter Outcome:  No Answer  Dreama [facility]  [facility], [facility] Health Care Management Assistant Direct Dial: [phone]  Fax: [phone]

## 2023-08-29 DIAGNOSIS — Z419 Encounter for procedure for purposes other than remedying health state, unspecified: Secondary | ICD-10-CM | POA: Diagnosis not present

## 2023-09-06 NOTE — Progress Notes (Unsigned)
 Complex Care Management Note Care Guide Note  09/06/2023 Name: Laura Shaffer MRN: [id number] DOB: 1990-02-10   Complex Care Management Outreach Attempts: A second unsuccessful outreach was attempted today to offer the patient with information about available complex care management services.  Follow Up Plan:  Additional outreach attempts will be made to offer the patient complex care management information and services.   Encounter Outcome:  No Answer  Laura Shaffer [facility]  [facility], [facility] VBCI Assistant Direct Dial: [phone]  Fax: [phone]

## 2023-09-08 NOTE — Progress Notes (Signed)
 Complex Care Management Note Care Guide Note  09/08/2023 Name: Laura Shaffer MRN: [id number] DOB: 27-Apr-1990   Complex Care Management Outreach Attempts: A third unsuccessful outreach was attempted today to offer the patient with information about available complex care management services.  Follow Up Plan:  No further outreach attempts will be made at this time. We have been unable to contact the patient to offer or enroll patient in complex care management services.  Encounter Outcome:  No Answer  Dreama [facility]  [facility], [facility] VBCI Assistant Direct Dial: [phone]  Fax: [phone]

## 2023-09-14 ENCOUNTER — Other Ambulatory Visit: Payer: Self-pay | Admitting: Medical Genetics

## 2023-09-27 ENCOUNTER — Ambulatory Visit

## 2023-09-27 DIAGNOSIS — Z7689 Persons encountering health services in other specified circumstances: Secondary | ICD-10-CM | POA: Diagnosis not present

## 2023-09-27 DIAGNOSIS — Z113 Encounter for screening for infections with a predominantly sexual mode of transmission: Secondary | ICD-10-CM | POA: Diagnosis not present

## 2023-09-27 DIAGNOSIS — N926 Irregular menstruation, unspecified: Secondary | ICD-10-CM

## 2023-09-27 LAB — WET PREP FOR TRICH, YEAST, CLUE
Clue Cell Exam: POSITIVE — AB
Trichomonas Exam: NEGATIVE
Yeast Exam: NEGATIVE

## 2023-09-27 LAB — PREGNANCY, URINE: Preg Test, Ur: NEGATIVE

## 2023-09-27 NOTE — Progress Notes (Signed)
 Pt is here for STD visit. Wet prep results reviewed with patient and requires no treatment per provider. Condoms declined. Wilkie Drought, RN.
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
## 2023-09-28 LAB — HBV ANTIGEN/ANTIBODY STATE LAB
Hep B Core Total Ab: NONREACTIVE
Hep B S Ab: NONREACTIVE
Hepatitis B Surface Antigen: NONREACTIVE

## 2023-09-29 DIAGNOSIS — Z419 Encounter for procedure for purposes other than remedying health state, unspecified: Secondary | ICD-10-CM | POA: Diagnosis not present

## 2023-10-01 LAB — GONOCOCCUS CULTURE

## 2023-10-05 ENCOUNTER — Telehealth: Payer: Self-pay | Admitting: Family Medicine

## 2023-10-20 ENCOUNTER — Telehealth: Payer: Self-pay | Admitting: Family Medicine

## 2023-10-27 ENCOUNTER — Other Ambulatory Visit: Payer: Self-pay | Admitting: Medical Genetics

## 2023-10-27 DIAGNOSIS — Z006 Encounter for examination for normal comparison and control in clinical research program: Secondary | ICD-10-CM

## 2023-11-25 LAB — GENECONNECT MOLECULAR SCREEN: Genetic Analysis Overall Interpretation: NEGATIVE

## 2023-11-29 DIAGNOSIS — Z419 Encounter for procedure for purposes other than remedying health state, unspecified: Secondary | ICD-10-CM | POA: Diagnosis not present

## 2023-12-22 ENCOUNTER — Telehealth: Payer: Self-pay | Admitting: Family Medicine

## 2024-01-04 DIAGNOSIS — Z7689 Persons encountering health services in other specified circumstances: Secondary | ICD-10-CM | POA: Diagnosis not present
